# Patient Record
Sex: Male | Born: 1940 | Race: White | Hispanic: No | Marital: Married | Smoking: Former smoker
Health system: Southern US, Community
[De-identification: ages and names within clinical notes are randomized; demographics above are authoritative.]

## PROBLEM LIST (undated history)

## (undated) DIAGNOSIS — I10 Essential (primary) hypertension: Secondary | ICD-10-CM

## (undated) DIAGNOSIS — E669 Obesity, unspecified: Secondary | ICD-10-CM

## (undated) DIAGNOSIS — E785 Hyperlipidemia, unspecified: Secondary | ICD-10-CM

## (undated) DIAGNOSIS — G4730 Sleep apnea, unspecified: Secondary | ICD-10-CM

## (undated) DIAGNOSIS — N429 Disorder of prostate, unspecified: Secondary | ICD-10-CM

## (undated) DIAGNOSIS — M1990 Unspecified osteoarthritis, unspecified site: Secondary | ICD-10-CM

## (undated) DIAGNOSIS — K219 Gastro-esophageal reflux disease without esophagitis: Secondary | ICD-10-CM

## (undated) HISTORY — PX: PROSTATE BIOPSY: SHX241

## (undated) HISTORY — DX: Hyperlipidemia, unspecified: E78.5

## (undated) HISTORY — PX: CARDIAC CATHETERIZATION: SHX172

## (undated) HISTORY — DX: Essential (primary) hypertension: I10

## (undated) HISTORY — DX: Obesity, unspecified: E66.9

## (undated) HISTORY — PX: TONSILLECTOMY: SUR1361

## (undated) HISTORY — PX: APPENDECTOMY: SHX54

## (undated) HISTORY — PX: ANKLE SURGERY: SHX546

## (undated) HISTORY — PX: COLONOSCOPY: SHX174

## (undated) HISTORY — PX: FRACTURE SURGERY: SHX138

---

## 2004-11-15 ENCOUNTER — Ambulatory Visit: Payer: Self-pay | Admitting: Internal Medicine

## 2005-02-03 ENCOUNTER — Ambulatory Visit: Payer: Self-pay | Admitting: Unknown Physician Specialty

## 2005-03-20 ENCOUNTER — Ambulatory Visit: Payer: Self-pay | Admitting: Unknown Physician Specialty

## 2006-09-21 ENCOUNTER — Inpatient Hospital Stay: Payer: Self-pay | Admitting: Internal Medicine

## 2006-09-21 ENCOUNTER — Other Ambulatory Visit: Payer: Self-pay

## 2006-10-17 ENCOUNTER — Ambulatory Visit: Payer: Self-pay | Admitting: Internal Medicine

## 2006-11-16 ENCOUNTER — Encounter: Payer: Self-pay | Admitting: Cardiology

## 2008-04-11 ENCOUNTER — Ambulatory Visit: Payer: Self-pay | Admitting: Internal Medicine

## 2008-10-25 ENCOUNTER — Ambulatory Visit: Payer: Self-pay | Admitting: Unknown Physician Specialty

## 2008-11-09 ENCOUNTER — Ambulatory Visit: Payer: Self-pay | Admitting: Unknown Physician Specialty

## 2011-06-12 ENCOUNTER — Encounter: Payer: Self-pay | Admitting: *Deleted

## 2012-11-23 ENCOUNTER — Ambulatory Visit: Payer: Self-pay | Admitting: Cardiology

## 2012-12-07 ENCOUNTER — Encounter: Payer: Self-pay | Admitting: Cardiology

## 2013-09-14 DIAGNOSIS — F32A Depression, unspecified: Secondary | ICD-10-CM | POA: Insufficient documentation

## 2013-09-14 DIAGNOSIS — M9979 Connective tissue and disc stenosis of intervertebral foramina of abdomen and other regions: Secondary | ICD-10-CM | POA: Insufficient documentation

## 2013-09-14 DIAGNOSIS — I10 Essential (primary) hypertension: Secondary | ICD-10-CM | POA: Insufficient documentation

## 2013-09-14 DIAGNOSIS — F419 Anxiety disorder, unspecified: Secondary | ICD-10-CM

## 2013-09-14 DIAGNOSIS — N400 Enlarged prostate without lower urinary tract symptoms: Secondary | ICD-10-CM | POA: Insufficient documentation

## 2013-09-14 DIAGNOSIS — E669 Obesity, unspecified: Secondary | ICD-10-CM | POA: Insufficient documentation

## 2013-09-14 DIAGNOSIS — G4733 Obstructive sleep apnea (adult) (pediatric): Secondary | ICD-10-CM | POA: Insufficient documentation

## 2013-09-14 DIAGNOSIS — F329 Major depressive disorder, single episode, unspecified: Secondary | ICD-10-CM | POA: Insufficient documentation

## 2013-09-14 DIAGNOSIS — E785 Hyperlipidemia, unspecified: Secondary | ICD-10-CM | POA: Insufficient documentation

## 2013-12-22 DIAGNOSIS — Z86010 Personal history of colonic polyps: Secondary | ICD-10-CM | POA: Insufficient documentation

## 2013-12-22 DIAGNOSIS — K760 Fatty (change of) liver, not elsewhere classified: Secondary | ICD-10-CM | POA: Insufficient documentation

## 2013-12-22 DIAGNOSIS — Z800 Family history of malignant neoplasm of digestive organs: Secondary | ICD-10-CM | POA: Insufficient documentation

## 2014-02-02 ENCOUNTER — Ambulatory Visit: Payer: Self-pay | Admitting: Unknown Physician Specialty

## 2014-06-19 LAB — SURGICAL PATHOLOGY

## 2014-09-20 DIAGNOSIS — E119 Type 2 diabetes mellitus without complications: Secondary | ICD-10-CM | POA: Insufficient documentation

## 2015-03-20 DIAGNOSIS — L03311 Cellulitis of abdominal wall: Secondary | ICD-10-CM | POA: Diagnosis not present

## 2015-03-20 DIAGNOSIS — E1122 Type 2 diabetes mellitus with diabetic chronic kidney disease: Secondary | ICD-10-CM | POA: Diagnosis not present

## 2015-03-20 DIAGNOSIS — N183 Chronic kidney disease, stage 3 (moderate): Secondary | ICD-10-CM | POA: Diagnosis not present

## 2015-04-24 DIAGNOSIS — Z79899 Other long term (current) drug therapy: Secondary | ICD-10-CM | POA: Diagnosis not present

## 2015-04-24 DIAGNOSIS — Z125 Encounter for screening for malignant neoplasm of prostate: Secondary | ICD-10-CM | POA: Diagnosis not present

## 2015-04-24 DIAGNOSIS — I10 Essential (primary) hypertension: Secondary | ICD-10-CM | POA: Diagnosis not present

## 2015-04-24 DIAGNOSIS — E782 Mixed hyperlipidemia: Secondary | ICD-10-CM | POA: Diagnosis not present

## 2015-04-24 DIAGNOSIS — N183 Chronic kidney disease, stage 3 (moderate): Secondary | ICD-10-CM | POA: Diagnosis not present

## 2015-04-24 DIAGNOSIS — E1122 Type 2 diabetes mellitus with diabetic chronic kidney disease: Secondary | ICD-10-CM | POA: Diagnosis not present

## 2015-05-02 DIAGNOSIS — E559 Vitamin D deficiency, unspecified: Secondary | ICD-10-CM | POA: Diagnosis not present

## 2015-05-02 DIAGNOSIS — Z794 Long term (current) use of insulin: Secondary | ICD-10-CM | POA: Diagnosis not present

## 2015-05-02 DIAGNOSIS — E781 Pure hyperglyceridemia: Secondary | ICD-10-CM | POA: Diagnosis not present

## 2015-05-02 DIAGNOSIS — E1122 Type 2 diabetes mellitus with diabetic chronic kidney disease: Secondary | ICD-10-CM | POA: Diagnosis not present

## 2015-05-02 DIAGNOSIS — N183 Chronic kidney disease, stage 3 (moderate): Secondary | ICD-10-CM | POA: Diagnosis not present

## 2015-05-02 DIAGNOSIS — G720 Drug-induced myopathy: Secondary | ICD-10-CM | POA: Diagnosis not present

## 2015-05-02 DIAGNOSIS — T466X1A Poisoning by antihyperlipidemic and antiarteriosclerotic drugs, accidental (unintentional), initial encounter: Secondary | ICD-10-CM | POA: Diagnosis not present

## 2015-05-18 ENCOUNTER — Ambulatory Visit (INDEPENDENT_AMBULATORY_CARE_PROVIDER_SITE_OTHER): Payer: PPO | Admitting: Urology

## 2015-05-18 ENCOUNTER — Encounter: Payer: Self-pay | Admitting: Urology

## 2015-05-18 VITALS — BP 122/69 | HR 62 | Ht 67.0 in | Wt 248.0 lb

## 2015-05-18 DIAGNOSIS — N183 Chronic kidney disease, stage 3 unspecified: Secondary | ICD-10-CM | POA: Insufficient documentation

## 2015-05-18 DIAGNOSIS — R350 Frequency of micturition: Secondary | ICD-10-CM

## 2015-05-18 DIAGNOSIS — R972 Elevated prostate specific antigen [PSA]: Secondary | ICD-10-CM | POA: Diagnosis not present

## 2015-05-18 DIAGNOSIS — R351 Nocturia: Secondary | ICD-10-CM | POA: Diagnosis not present

## 2015-05-18 LAB — MICROSCOPIC EXAMINATION
Bacteria, UA: NONE SEEN
Epithelial Cells (non renal): NONE SEEN /hpf (ref 0–10)
RBC, UA: NONE SEEN /hpf (ref 0–?)
WBC, UA: NONE SEEN /hpf (ref 0–?)

## 2015-05-18 LAB — URINALYSIS, COMPLETE
Bilirubin, UA: NEGATIVE
GLUCOSE, UA: NEGATIVE
KETONES UA: NEGATIVE
LEUKOCYTES UA: NEGATIVE
NITRITE UA: NEGATIVE
PROTEIN UA: NEGATIVE
RBC, UA: NEGATIVE
SPEC GRAV UA: 1.02 (ref 1.005–1.030)
Urobilinogen, Ur: 0.2 mg/dL (ref 0.2–1.0)
pH, UA: 5.5 (ref 5.0–7.5)

## 2015-05-18 NOTE — Progress Notes (Signed)
**Note Mario-Identified via Obfuscation** 05/18/2015 1:45 PM   Mario Robinson [birth year] [id number]  Referring provider: No referring provider defined for this encounter.  Chief Complaint  Patient presents with  . Elevated PSA    New Patient    HPI: 75 yo M referred for Dr. Doy Hutching for evaluation of elevated PSA and urinary symptoms.    Today, he complains of nocturia 2-3x, urinary urgency, and occasional urge incontinence.  He does have daytime frequency but he does admit to drinking plenty of water.  He does have an occasionally weak stream and does feel that he emtpy his bladder for the most part.    He does drink 2-3 cups of coffee each day.    He does have a family history of prostate cancer with 2 brothers dx in there late 31s.    PSA 5/15 3.94         7/16 4.58         2/17 5.27  PHx significant for DM x 20 years, obesity, OSA (does not wear a CPAP).    PMH: Past Medical History  Diagnosis Date  . Obesity   . Diabetes mellitus   . HTN (hypertension)   . HLD (hyperlipidemia)     Surgical History: Past Surgical History  Procedure Laterality Date  . Appendectomy    . Ankle surgery    . Tonsillectomy      Home Medications:    Medication List       This list is accurate as of: 05/18/15  1:45 PM.  Always use your most recent med list.               alfuzosin 10 MG 24 hr tablet  Commonly known as:  UROXATRAL     amLODipine 5 MG tablet  Commonly known as:  NORVASC     aspirin 81 MG tablet  Take 81 mg by mouth daily.     Fish Oil 600 MG Caps  Take by mouth.     glipiZIDE 10 MG 24 hr tablet  Commonly known as:  GLUCOTROL XL  Take 10 mg by mouth daily.     hydrochlorothiazide 12.5 MG capsule  Commonly known as:  MICROZIDE  Take 12.5 mg by mouth daily.     lisinopril 20 MG tablet  Commonly known as:  PRINIVIL,ZESTRIL  Take 30 mg by mouth daily.     losartan-hydrochlorothiazide 100-12.5 MG tablet  Commonly known as:  HYZAAR     metFORMIN 1000 MG tablet  Commonly known as:   GLUCOPHAGE  Take 1,000 mg by mouth 2 (two) times daily with a meal.     metoprolol 50 MG tablet  Commonly known as:  LOPRESSOR  Take 50 mg by mouth 2 (two) times daily.     mupirocin ointment 2 %  Commonly known as:  BACTROBAN     NOVOLIN 70/30 RELION (70-30) 100 UNIT/ML injection  Generic drug:  insulin NPH-regular Human     omeprazole 20 MG capsule  Commonly known as:  PRILOSEC  Take 20 mg by mouth daily.     Vitamin D3 2000 units capsule  Take by mouth.        Allergies: No Known Allergies  Family History: Family History  Problem Relation Age of Onset  . Heart attack    . Colon cancer Mother   . Prostate cancer Brother   . Hypertension    . Arthritis    . Bladder Cancer Neg Hx   . Kidney cancer Neg Hx  Social History:  reports that he has quit smoking. He does not have any smokeless tobacco history on file. He reports that he drinks alcohol. He reports that he does not use illicit drugs.  ROS: UROLOGY Frequent Urination?: Yes Hard to postpone urination?: Yes Burning/pain with urination?: No Get up at night to urinate?: Yes Leakage of urine?: Yes Urine stream starts and stops?: No Trouble starting stream?: No Do you have to strain to urinate?: No Blood in urine?: No Urinary tract infection?: No Sexually transmitted disease?: No Injury to kidneys or bladder?: No Painful intercourse?: No Weak stream?: Yes Erection problems?: Yes Penile pain?: No  Gastrointestinal Nausea?: No Vomiting?: No Indigestion/heartburn?: Yes Diarrhea?: No Constipation?: No  Constitutional Fever: No Night sweats?: No Weight loss?: No Fatigue?: No  Skin Skin rash/lesions?: No Itching?: No  Eyes Blurred vision?: No Double vision?: No  Ears/Nose/Throat Sore throat?: No Sinus problems?: Yes  Hematologic/Lymphatic Swollen glands?: No Easy bruising?: No  Cardiovascular Leg swelling?: No Chest pain?: No  Respiratory Cough?: No Shortness of breath?:  No  Endocrine Excessive thirst?: No  Musculoskeletal Back pain?: No Joint pain?: No  Neurological Headaches?: No Dizziness?: No  Psychologic Depression?: No Anxiety?: No  Physical Exam: BP 122/69 mmHg  Pulse 62  Ht 5\' 7"  (1.702 m)  Wt 248 lb (112.492 kg)  BMI 38.83 kg/m2  Constitutional:  Alert and oriented, No acute distress.   Accompanied by his wife today. HEENT: [place] AT, moist mucus membranes.  Trachea midline, no masses. Cardiovascular: No clubbing, cyanosis, or edema. Respiratory: Normal respiratory effort, no increased work of breathing. GI: Abdomen is soft, nontender, nondistended, no abdominal masses.   Obese with large pannus. GU: No CVA tenderness. Normal phallus with bilateral descended testicles. No masses.   DRE: Normal sphincter tone.  Small 20 g prostate, nontender, no nodules.   Skin: No rashes, bruises or suspicious lesions. Lymph: No cervical or inguinal adenopathy. Neurologic: Grossly intact, no focal deficits, moving all 4 extremities. Psychiatric: Normal mood and affect.  Laboratory Data:  A1c 6.9 04/24/15  CBC w/auto Differential (5 Part) (04/24/2015 7:49 AM) CBC w/auto Differential (5 Part) (04/24/2015 7:49 AM)  Component Value Ref Range  WBC (White Blood Cell Count) 11.5 (H) 4.1 - 10.2 10^3/uL  RBC (Red Blood Cell Count) 4.39 (L) 4.69 - 6.13 10^6/uL  Hemoglobin 13.5 (L) 14.1 - 18.1 gm/dL  Hematocrit 39.8 (L) 40.0 - 52.0 %   Comprehensive Metabolic Panel (CMP) (XX123456 7:49 AM) Comprehensive Metabolic Panel (CMP) (XX123456 7:49 AM)  Component Value Ref Range  Glucose 157 (H) 70 - 110 mg/dL  Sodium 140 136 - 145 mmol/L  Potassium 5.3 (H) 3.6 - 5.1 mmol/L  Chloride 106 97 - 109 mmol/L  Carbon Dioxide (CO2) 23.6 22.0 - 32.0 mmol/L  Urea Nitrogen (BUN) 25 7 - 25 mg/dL  Creatinine 1.9 (H) 0.7 - 1.3 mg/dL    Urinalysis UA today with negative dip/ micro.  See EPIC for details.   Pertinent Imaging: n/a  Assessment & Plan:    1.  Elevated PSA  We reviewed the implications of an elevated PSA and the uncertainty surrounding it. In general, a man's PSA increases with age and is produced by both normal and cancerous prostate tissue. Differential for elevated PSA is BPH, prostate cancer, infection, recent intercourse/ejaculation, prostate infarction, recent urethroscopic manipulation (foley placement/cystoscopy) and prostatitis. Management of an elevated PSA can include observation or prostate biopsy and wediscussed this in detail. We discussed that indications for prostate biopsy are defined by age and  race specific PSA cutoffs as well as a PSA velocity of 0.75/year.   Given the overall trend of his PSA with a slow steady rise, I'm highly concerning for prostate cancer. Additionally, he is a family history of prostate cancer. As such, I have recommended proceeding with prostate biopsy. He will need to hold his aspirin for 7 days prior to the procedure.  We discussed prostate biopsy in detail including the procedure itself, the risks of blood in the urine, stool, and ejaculate, serious infection, and discomfort. He is willing to proceed with this as discussed.  - Urinalysis, Complete  2. CKD (chronic kidney disease) stage 3, GFR 30-59 ml/min stable  3. Urinary frequency  Discussed behavioral modification today cutting back bladder irritants including coffee. 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  4. Nocturia  Discussed nocturia and relationship to untreated OSA. I have advised him to discuss this further with his PCP as he may consider referral back to sleep center for reevaluation. We discussed that there are newer masks including nasal mask which may be more comfortable. I suspect treating his OSA may improve his nocturia symptoms.    Return in about 2 weeks (around 06/01/2015) for prostate biopsy.  Hollice Espy, MD  [facility] [address], [place], [place] [address] [phone]

## 2015-06-15 ENCOUNTER — Ambulatory Visit (INDEPENDENT_AMBULATORY_CARE_PROVIDER_SITE_OTHER): Payer: PPO | Admitting: Urology

## 2015-06-15 ENCOUNTER — Other Ambulatory Visit: Payer: Self-pay | Admitting: Urology

## 2015-06-15 VITALS — BP 146/75 | HR 72 | Ht 67.0 in | Wt 244.0 lb

## 2015-06-15 DIAGNOSIS — R972 Elevated prostate specific antigen [PSA]: Secondary | ICD-10-CM | POA: Diagnosis not present

## 2015-06-15 MED ORDER — LEVOFLOXACIN 500 MG PO TABS
500.0000 mg | ORAL_TABLET | Freq: Once | ORAL | Status: AC
  Administered 2015-06-15: 500 mg via ORAL

## 2015-06-15 MED ORDER — GENTAMICIN SULFATE 40 MG/ML IJ SOLN
80.0000 mg | Freq: Once | INTRAMUSCULAR | Status: AC
  Administered 2015-06-15: 80 mg via INTRAMUSCULAR

## 2015-06-15 NOTE — Progress Notes (Signed)
06/15/2015   HPI: 75 year old male with rising PSA who presents a for prostate biopsy. Rectal exam benign with small gland.  Strong family history of prostate cancer.  PSA 5/15 3.94  7/16 4.58  2/17 5.27  Prostate Biopsy Procedure   Informed consent was obtained after discussing risks/benefits of the procedure.  A time out was performed to ensure correct patient identity.  Pre-Procedure: - Gentamicin given prophylactically - Levaquin 500 mg administered PO -Transrectal Ultrasound performed revealing a 50 gm prostate -No significant hypoechoic or median lobe noted  Procedure: - Prostate block performed using 10 cc 1% lidocaine and biopsies taken from sextant areas, a total of 12 under ultrasound guidance.  Post-Procedure: - Patient tolerated the procedure well - He was counseled to seek immediate medical attention if experiences any severe pain, significant bleeding, or fevers - Return in one week to discuss biopsy results  Hollice Espy, MD

## 2015-06-21 ENCOUNTER — Telehealth: Payer: Self-pay | Admitting: Urology

## 2015-06-21 LAB — PATHOLOGY REPORT

## 2015-06-21 NOTE — Telephone Encounter (Signed)
done

## 2015-06-21 NOTE — Telephone Encounter (Signed)
Prostate biopsy results negative.  Evidence of acute and chronic inflammation which may be cause of PSA elevation.  Called patient to discuss.  Recommend f/u in 6 months with PSA/ DRE.  Hollice Espy, MD

## 2015-06-22 ENCOUNTER — Other Ambulatory Visit: Payer: Self-pay | Admitting: Urology

## 2015-06-26 ENCOUNTER — Ambulatory Visit: Payer: PPO | Admitting: Urology

## 2015-07-03 ENCOUNTER — Ambulatory Visit: Payer: PPO | Admitting: Urology

## 2015-07-24 DIAGNOSIS — E1122 Type 2 diabetes mellitus with diabetic chronic kidney disease: Secondary | ICD-10-CM | POA: Diagnosis not present

## 2015-07-24 DIAGNOSIS — Z79899 Other long term (current) drug therapy: Secondary | ICD-10-CM | POA: Diagnosis not present

## 2015-07-24 DIAGNOSIS — Z0000 Encounter for general adult medical examination without abnormal findings: Secondary | ICD-10-CM | POA: Diagnosis not present

## 2015-07-24 DIAGNOSIS — E782 Mixed hyperlipidemia: Secondary | ICD-10-CM | POA: Diagnosis not present

## 2015-07-24 DIAGNOSIS — E6609 Other obesity due to excess calories: Secondary | ICD-10-CM | POA: Diagnosis not present

## 2015-07-24 DIAGNOSIS — N183 Chronic kidney disease, stage 3 (moderate): Secondary | ICD-10-CM | POA: Diagnosis not present

## 2015-07-24 DIAGNOSIS — I10 Essential (primary) hypertension: Secondary | ICD-10-CM | POA: Diagnosis not present

## 2015-07-24 DIAGNOSIS — L739 Follicular disorder, unspecified: Secondary | ICD-10-CM | POA: Diagnosis not present

## 2015-07-26 DIAGNOSIS — Z794 Long term (current) use of insulin: Secondary | ICD-10-CM | POA: Diagnosis not present

## 2015-07-26 DIAGNOSIS — N183 Chronic kidney disease, stage 3 (moderate): Secondary | ICD-10-CM | POA: Diagnosis not present

## 2015-07-26 DIAGNOSIS — I10 Essential (primary) hypertension: Secondary | ICD-10-CM | POA: Diagnosis not present

## 2015-07-26 DIAGNOSIS — E1122 Type 2 diabetes mellitus with diabetic chronic kidney disease: Secondary | ICD-10-CM | POA: Diagnosis not present

## 2015-07-26 DIAGNOSIS — Z79899 Other long term (current) drug therapy: Secondary | ICD-10-CM | POA: Diagnosis not present

## 2015-07-26 DIAGNOSIS — E559 Vitamin D deficiency, unspecified: Secondary | ICD-10-CM | POA: Diagnosis not present

## 2015-08-02 DIAGNOSIS — N183 Chronic kidney disease, stage 3 (moderate): Secondary | ICD-10-CM | POA: Diagnosis not present

## 2015-08-02 DIAGNOSIS — T466X1A Poisoning by antihyperlipidemic and antiarteriosclerotic drugs, accidental (unintentional), initial encounter: Secondary | ICD-10-CM | POA: Diagnosis not present

## 2015-08-02 DIAGNOSIS — E559 Vitamin D deficiency, unspecified: Secondary | ICD-10-CM | POA: Diagnosis not present

## 2015-08-02 DIAGNOSIS — E1122 Type 2 diabetes mellitus with diabetic chronic kidney disease: Secondary | ICD-10-CM | POA: Diagnosis not present

## 2015-08-02 DIAGNOSIS — Z794 Long term (current) use of insulin: Secondary | ICD-10-CM | POA: Diagnosis not present

## 2015-08-02 DIAGNOSIS — G720 Drug-induced myopathy: Secondary | ICD-10-CM | POA: Diagnosis not present

## 2015-08-02 DIAGNOSIS — E781 Pure hyperglyceridemia: Secondary | ICD-10-CM | POA: Diagnosis not present

## 2015-11-28 DIAGNOSIS — N183 Chronic kidney disease, stage 3 (moderate): Secondary | ICD-10-CM | POA: Diagnosis not present

## 2015-11-28 DIAGNOSIS — Z794 Long term (current) use of insulin: Secondary | ICD-10-CM | POA: Diagnosis not present

## 2015-11-28 DIAGNOSIS — E1122 Type 2 diabetes mellitus with diabetic chronic kidney disease: Secondary | ICD-10-CM | POA: Diagnosis not present

## 2015-12-05 DIAGNOSIS — E1122 Type 2 diabetes mellitus with diabetic chronic kidney disease: Secondary | ICD-10-CM | POA: Diagnosis not present

## 2015-12-05 DIAGNOSIS — Z794 Long term (current) use of insulin: Secondary | ICD-10-CM | POA: Diagnosis not present

## 2015-12-05 DIAGNOSIS — Z8639 Personal history of other endocrine, nutritional and metabolic disease: Secondary | ICD-10-CM | POA: Diagnosis not present

## 2015-12-05 DIAGNOSIS — N183 Chronic kidney disease, stage 3 (moderate): Secondary | ICD-10-CM | POA: Diagnosis not present

## 2015-12-21 ENCOUNTER — Ambulatory Visit: Payer: PPO | Admitting: Urology

## 2015-12-24 ENCOUNTER — Other Ambulatory Visit: Payer: Self-pay

## 2015-12-24 DIAGNOSIS — R972 Elevated prostate specific antigen [PSA]: Secondary | ICD-10-CM

## 2015-12-25 ENCOUNTER — Other Ambulatory Visit: Payer: PPO

## 2015-12-25 DIAGNOSIS — R972 Elevated prostate specific antigen [PSA]: Secondary | ICD-10-CM

## 2015-12-26 ENCOUNTER — Encounter: Payer: Self-pay | Admitting: Urology

## 2015-12-26 ENCOUNTER — Ambulatory Visit (INDEPENDENT_AMBULATORY_CARE_PROVIDER_SITE_OTHER): Payer: PPO | Admitting: Urology

## 2015-12-26 VITALS — BP 130/73 | HR 62 | Ht 67.0 in | Wt 249.0 lb

## 2015-12-26 DIAGNOSIS — R972 Elevated prostate specific antigen [PSA]: Secondary | ICD-10-CM

## 2015-12-26 DIAGNOSIS — N183 Chronic kidney disease, stage 3 unspecified: Secondary | ICD-10-CM

## 2015-12-26 DIAGNOSIS — R351 Nocturia: Secondary | ICD-10-CM

## 2015-12-26 DIAGNOSIS — R350 Frequency of micturition: Secondary | ICD-10-CM

## 2015-12-26 LAB — PSA: Prostate Specific Ag, Serum: 5.1 ng/mL — ABNORMAL HIGH (ref 0.0–4.0)

## 2015-12-26 NOTE — Progress Notes (Signed)
12/26/2015 1:32 PM   De Burrs Aug 11, 1940 [id number]  Referring provider: Idelle Crouch, MD [facility] [place], [place] [address]  Chief Complaint  Patient presents with  . Elevated PSA    38month follow up    HPI: 75 yo M initally referred for Dr. Doy Hutching for elevated PSA s/p negative prostate biopsy on 06/15/15 with chronic inflammation.  TRUS volume 50 g.  He does have a family history of prostate cancer with 2 brothers dx in there late 73s.    PSA 5/15 3.94         7/16 4.58         2/17 5.27         10/17  5.1  He continues to complain urinary frequency and nocturia x 2-4.  He continues to be Noncompliant with CPAP and behavioral modification. He continues to drink 3 or more cups of coffee each morning.  PHx significant for DM x 20 years, obesity, OSA (does not wear a CPAP).    PMH: Past Medical History:  Diagnosis Date  . Diabetes mellitus   . HLD (hyperlipidemia)   . HTN (hypertension)   . Obesity     Surgical History: Past Surgical History:  Procedure Laterality Date  . ANKLE SURGERY    . APPENDECTOMY    . TONSILLECTOMY      Home Medications:    Medication List       Accurate as of 12/26/15  1:32 PM. Always use your most recent med list.          alfuzosin 10 MG 24 hr tablet Commonly known as:  UROXATRAL   amLODipine 5 MG tablet Commonly known as:  NORVASC   amoxicillin 875 MG tablet Commonly known as:  AMOXIL   aspirin 81 MG tablet Take 81 mg by mouth daily. Reported on 06/15/2015   Fish Oil 600 MG Caps Take by mouth.   glipiZIDE 10 MG 24 hr tablet Commonly known as:  GLUCOTROL XL Take 10 mg by mouth daily.   hydrochlorothiazide 12.5 MG capsule Commonly known as:  MICROZIDE Take 12.5 mg by mouth daily.   lisinopril 20 MG tablet Commonly known as:  PRINIVIL,ZESTRIL Take 30 mg by mouth daily.   losartan-hydrochlorothiazide 100-12.5 MG tablet Commonly known as:  HYZAAR   metFORMIN 1000 MG  tablet Commonly known as:  GLUCOPHAGE Take 1,000 mg by mouth 2 (two) times daily with a meal.   metoprolol 50 MG tablet Commonly known as:  LOPRESSOR Take 50 mg by mouth 2 (two) times daily.   mupirocin ointment 2 % Commonly known as:  BACTROBAN   NOVOLIN 70/30 RELION (70-30) 100 UNIT/ML injection Generic drug:  insulin NPH-regular Human   omeprazole 20 MG capsule Commonly known as:  PRILOSEC Take 20 mg by mouth daily.   Vitamin D3 2000 units capsule Take by mouth. Reported on 06/15/2015       Allergies: No Known Allergies  Family History: Family History  Problem Relation Age of Onset  . Heart attack    . Colon cancer Mother   . Prostate cancer Brother   . Hypertension    . Arthritis    . Bladder Cancer Neg Hx   . Kidney cancer Neg Hx     Social History:  reports that he has quit smoking. He quit after 35.00 years of use. He has never used smokeless tobacco. He reports that he drinks alcohol. He reports that he does not use drugs.  ROS: UROLOGY Frequent Urination?: Yes Hard to postpone urination?: Yes Burning/pain with urination?: No Get up at night to urinate?: Yes Leakage of urine?: Yes Urine stream starts and stops?: No Trouble starting stream?: No Do you have to strain to urinate?: No Blood in urine?: No Urinary tract infection?: No Sexually transmitted disease?: No Injury to kidneys or bladder?: No Painful intercourse?: No Weak stream?: No Erection problems?: Yes Penile pain?: No  Gastrointestinal Nausea?: No Vomiting?: No Indigestion/heartburn?: No Diarrhea?: No Constipation?: No  Constitutional Fever: No Night sweats?: No Weight loss?: No Fatigue?: No  Skin Skin rash/lesions?: No Itching?: No  Eyes Blurred vision?: No Double vision?: No  Ears/Nose/Throat Sore throat?: No Sinus problems?: No  Hematologic/Lymphatic Swollen glands?: No Easy bruising?: No  Cardiovascular Leg swelling?: No Chest pain?:  No  Respiratory Cough?: No Shortness of breath?: No  Endocrine Excessive thirst?: No  Musculoskeletal Back pain?: No Joint pain?: No  Neurological Headaches?: No Dizziness?: No  Psychologic Depression?: No Anxiety?: No  Physical Exam: BP 130/73   Pulse 62   Ht 5\' 7"  (1.702 m)   Wt 249 lb (112.9 kg)   BMI 39.00 kg/m   Constitutional:  Alert and oriented, No acute distress.   Accompanied by his wife today. HEENT: [place] AT, moist mucus membranes.  Trachea midline, no masses. Cardiovascular: No clubbing, cyanosis, or edema. Respiratory: Normal respiratory effort, no increased work of breathing. GI: Abdomen is soft, nontender, nondistended, no abdominal masses.   Obese with large pannus. GU: No CVA tenderness. Normal phallus with bilateral descended testicles. No masses.   Skin: No rashes, bruises or suspicious lesions. Neurologic: Grossly intact, no focal deficits, moving all 4 extremities. Psychiatric: Normal mood and affect.  Laboratory Data:  A1c 6.9 04/24/15  CBC w/auto Differential (5 Part) (04/24/2015 7:49 AM) CBC w/auto Differential (5 Part) (04/24/2015 7:49 AM)  Component Value Ref Range  WBC (White Blood Cell Count) 11.5 (H) 4.1 - 10.2 10^3/uL  RBC (Red Blood Cell Count) 4.39 (L) 4.69 - 6.13 10^6/uL  Hemoglobin 13.5 (L) 14.1 - 18.1 gm/dL  Hematocrit 39.8 (L) 40.0 - 52.0 %   Comprehensive Metabolic Panel (CMP) (56/31/4970 7:49 AM) Comprehensive Metabolic Panel (CMP) (26/37/8588 7:49 AM)  Component Value Ref Range  Glucose 157 (H) 70 - 110 mg/dL  Sodium 140 136 - 145 mmol/L  Potassium 5.3 (H) 3.6 - 5.1 mmol/L  Chloride 106 97 - 109 mmol/L  Carbon Dioxide (CO2) 23.6 22.0 - 32.0 mmol/L  Urea Nitrogen (BUN) 25 7 - 25 mg/dL  Creatinine 1.9 (H) 0.7 - 1.3 mg/dL     Pertinent Imaging: n/a  Assessment & Plan:    1. Elevated PSA PSA stable today Status post negative biopsy with chronic inflammation 6 months ago Based on his age and comorbidities, I would  recommend one final PSA check in 12 months with rectal exam and consider cessation  PSA screening at that time- he is agreeable with this plan  2. CKD (chronic kidney disease) stage 3, GFR 30-59 ml/min stable  3. Urinary frequency Continue to recommend behavioral modification including weight loss, avoidance of your tearing beverages, etc.  4. Nocturia Encourage follow-up with PCP for referral for her repeat sleep study   Return in about 1 year (around 12/25/2016) for PSA /DRE.  Hollice Espy, MD  [facility] [address], [place], [place] [address] [phone]

## 2016-01-23 DIAGNOSIS — E782 Mixed hyperlipidemia: Secondary | ICD-10-CM | POA: Diagnosis not present

## 2016-01-23 DIAGNOSIS — N400 Enlarged prostate without lower urinary tract symptoms: Secondary | ICD-10-CM | POA: Diagnosis not present

## 2016-01-23 DIAGNOSIS — Z79899 Other long term (current) drug therapy: Secondary | ICD-10-CM | POA: Diagnosis not present

## 2016-01-23 DIAGNOSIS — E1122 Type 2 diabetes mellitus with diabetic chronic kidney disease: Secondary | ICD-10-CM | POA: Diagnosis not present

## 2016-01-23 DIAGNOSIS — I10 Essential (primary) hypertension: Secondary | ICD-10-CM | POA: Diagnosis not present

## 2016-01-23 DIAGNOSIS — N183 Chronic kidney disease, stage 3 (moderate): Secondary | ICD-10-CM | POA: Diagnosis not present

## 2016-01-23 DIAGNOSIS — Z0000 Encounter for general adult medical examination without abnormal findings: Secondary | ICD-10-CM | POA: Diagnosis not present

## 2016-02-04 DIAGNOSIS — I10 Essential (primary) hypertension: Secondary | ICD-10-CM | POA: Diagnosis not present

## 2016-02-29 DIAGNOSIS — H2513 Age-related nuclear cataract, bilateral: Secondary | ICD-10-CM | POA: Diagnosis not present

## 2016-04-04 DIAGNOSIS — Z8639 Personal history of other endocrine, nutritional and metabolic disease: Secondary | ICD-10-CM | POA: Diagnosis not present

## 2016-04-04 DIAGNOSIS — N183 Chronic kidney disease, stage 3 (moderate): Secondary | ICD-10-CM | POA: Diagnosis not present

## 2016-04-04 DIAGNOSIS — I10 Essential (primary) hypertension: Secondary | ICD-10-CM | POA: Diagnosis not present

## 2016-04-04 DIAGNOSIS — E1122 Type 2 diabetes mellitus with diabetic chronic kidney disease: Secondary | ICD-10-CM | POA: Diagnosis not present

## 2016-04-04 DIAGNOSIS — E782 Mixed hyperlipidemia: Secondary | ICD-10-CM | POA: Diagnosis not present

## 2016-04-04 DIAGNOSIS — Z794 Long term (current) use of insulin: Secondary | ICD-10-CM | POA: Diagnosis not present

## 2016-04-04 DIAGNOSIS — Z79899 Other long term (current) drug therapy: Secondary | ICD-10-CM | POA: Diagnosis not present

## 2016-04-09 DIAGNOSIS — E781 Pure hyperglyceridemia: Secondary | ICD-10-CM | POA: Diagnosis not present

## 2016-04-09 DIAGNOSIS — N183 Chronic kidney disease, stage 3 (moderate): Secondary | ICD-10-CM | POA: Diagnosis not present

## 2016-04-09 DIAGNOSIS — E1122 Type 2 diabetes mellitus with diabetic chronic kidney disease: Secondary | ICD-10-CM | POA: Diagnosis not present

## 2016-04-09 DIAGNOSIS — Z794 Long term (current) use of insulin: Secondary | ICD-10-CM | POA: Diagnosis not present

## 2016-04-22 DIAGNOSIS — H2513 Age-related nuclear cataract, bilateral: Secondary | ICD-10-CM | POA: Diagnosis not present

## 2016-04-23 ENCOUNTER — Encounter: Payer: Self-pay | Admitting: *Deleted

## 2016-05-01 ENCOUNTER — Ambulatory Visit: Payer: PPO | Admitting: Anesthesiology

## 2016-05-01 ENCOUNTER — Ambulatory Visit
Admission: RE | Admit: 2016-05-01 | Discharge: 2016-05-01 | Disposition: A | Discharge status: Home / Self Care | Payer: PPO | Source: Ambulatory Visit | Attending: Ophthalmology | Admitting: Ophthalmology

## 2016-05-01 ENCOUNTER — Encounter
Admission: RE | Disposition: A | Discharge status: Home / Self Care | Payer: Self-pay | Source: Ambulatory Visit | Attending: Ophthalmology

## 2016-05-01 ENCOUNTER — Encounter: Payer: Self-pay | Admitting: *Deleted

## 2016-05-01 DIAGNOSIS — Z87891 Personal history of nicotine dependence: Secondary | ICD-10-CM | POA: Insufficient documentation

## 2016-05-01 DIAGNOSIS — I10 Essential (primary) hypertension: Secondary | ICD-10-CM | POA: Insufficient documentation

## 2016-05-01 DIAGNOSIS — E1136 Type 2 diabetes mellitus with diabetic cataract: Secondary | ICD-10-CM | POA: Diagnosis not present

## 2016-05-01 DIAGNOSIS — M1990 Unspecified osteoarthritis, unspecified site: Secondary | ICD-10-CM | POA: Diagnosis not present

## 2016-05-01 DIAGNOSIS — K219 Gastro-esophageal reflux disease without esophagitis: Secondary | ICD-10-CM | POA: Diagnosis not present

## 2016-05-01 DIAGNOSIS — H2512 Age-related nuclear cataract, left eye: Secondary | ICD-10-CM | POA: Diagnosis not present

## 2016-05-01 DIAGNOSIS — E119 Type 2 diabetes mellitus without complications: Secondary | ICD-10-CM | POA: Diagnosis not present

## 2016-05-01 DIAGNOSIS — G4730 Sleep apnea, unspecified: Secondary | ICD-10-CM | POA: Diagnosis not present

## 2016-05-01 DIAGNOSIS — H2513 Age-related nuclear cataract, bilateral: Secondary | ICD-10-CM | POA: Diagnosis not present

## 2016-05-01 DIAGNOSIS — N289 Disorder of kidney and ureter, unspecified: Secondary | ICD-10-CM | POA: Insufficient documentation

## 2016-05-01 HISTORY — DX: Gastro-esophageal reflux disease without esophagitis: K21.9

## 2016-05-01 HISTORY — DX: Sleep apnea, unspecified: G47.30

## 2016-05-01 HISTORY — DX: Unspecified osteoarthritis, unspecified site: M19.90

## 2016-05-01 HISTORY — DX: Disorder of prostate, unspecified: N42.9

## 2016-05-01 HISTORY — PX: CATARACT EXTRACTION W/PHACO: SHX586

## 2016-05-01 LAB — GLUCOSE, CAPILLARY: Glucose-Capillary: 128 mg/dL — ABNORMAL HIGH (ref 65–99)

## 2016-05-01 SURGERY — PHACOEMULSIFICATION, CATARACT, WITH IOL INSERTION
Anesthesia: Monitor Anesthesia Care | Site: Eye | Laterality: Left | Wound class: Clean

## 2016-05-01 MED ORDER — SODIUM HYALURONATE 23 MG/ML IO SOLN
INTRAOCULAR | Status: AC
  Filled 2016-05-01: qty 0.6

## 2016-05-01 MED ORDER — ARMC OPHTHALMIC DILATING DROPS
1.0000 "application " | OPHTHALMIC | Status: AC
  Administered 2016-05-01 (×3): 1 via OPHTHALMIC

## 2016-05-01 MED ORDER — FENTANYL CITRATE (PF) 100 MCG/2ML IJ SOLN
INTRAMUSCULAR | Status: AC
  Filled 2016-05-01: qty 2

## 2016-05-01 MED ORDER — MIDAZOLAM HCL 2 MG/2ML IJ SOLN
INTRAMUSCULAR | Status: AC
  Filled 2016-05-01: qty 2

## 2016-05-01 MED ORDER — POVIDONE-IODINE 5 % OP SOLN
OPHTHALMIC | Status: AC
  Filled 2016-05-01: qty 30

## 2016-05-01 MED ORDER — MOXIFLOXACIN HCL 0.5 % OP SOLN
OPHTHALMIC | Status: DC | PRN
  Administered 2016-05-01: 0.2 mL via OPHTHALMIC

## 2016-05-01 MED ORDER — LIDOCAINE HCL (PF) 4 % IJ SOLN
INTRAOCULAR | Status: DC | PRN
  Administered 2016-05-01: 4 mL via OPHTHALMIC

## 2016-05-01 MED ORDER — BSS IO SOLN
INTRAOCULAR | Status: DC | PRN
  Administered 2016-05-01: 200 mL via INTRAOCULAR

## 2016-05-01 MED ORDER — MIDAZOLAM HCL 2 MG/2ML IJ SOLN
INTRAMUSCULAR | Status: DC | PRN
  Administered 2016-05-01: 1 mg via INTRAVENOUS

## 2016-05-01 MED ORDER — SODIUM HYALURONATE 10 MG/ML IO SOLN
INTRAOCULAR | Status: DC | PRN
  Administered 2016-05-01: 0.55 mL via INTRAOCULAR

## 2016-05-01 MED ORDER — MOXIFLOXACIN HCL 0.5 % OP SOLN: 1.0000 [drp] | OPHTHALMIC | Status: DC | PRN

## 2016-05-01 MED ORDER — EPINEPHRINE PF 1 MG/ML IJ SOLN
INTRAMUSCULAR | Status: AC
  Filled 2016-05-01: qty 1

## 2016-05-01 MED ORDER — SODIUM CHLORIDE 0.9 % IV SOLN
INTRAVENOUS | Status: DC
  Administered 2016-05-01: 09:00:00 via INTRAVENOUS

## 2016-05-01 MED ORDER — FENTANYL CITRATE (PF) 100 MCG/2ML IJ SOLN
INTRAMUSCULAR | Status: DC | PRN
  Administered 2016-05-01: 50 ug via INTRAVENOUS

## 2016-05-01 MED ORDER — SODIUM HYALURONATE 10 MG/ML IO SOLN
INTRAOCULAR | Status: AC
  Filled 2016-05-01: qty 0.85

## 2016-05-01 MED ORDER — ONDANSETRON HCL 4 MG/2ML IJ SOLN
INTRAMUSCULAR | Status: DC | PRN
  Administered 2016-05-01: 4 mg via INTRAVENOUS

## 2016-05-01 MED ORDER — SODIUM HYALURONATE 23 MG/ML IO SOLN
INTRAOCULAR | Status: DC | PRN
  Administered 2016-05-01: 0.6 mL via INTRAOCULAR

## 2016-05-01 SURGICAL SUPPLY — 22 items
CANNULA ANT/CHMB 27GA (MISCELLANEOUS) ×4 IMPLANT
CUP MEDICINE 2OZ PLAST GRAD ST (MISCELLANEOUS) ×2 IMPLANT
DISSECTOR HYDRO NUCLEUS 50X22 (MISCELLANEOUS) ×2 IMPLANT
GLOVE BIO SURGEON STRL SZ8 (GLOVE) ×2 IMPLANT
GLOVE BIOGEL M 6.5 STRL (GLOVE) ×2 IMPLANT
GLOVE SURG LX 6.5 MICRO (GLOVE) ×1
GLOVE SURG LX 7.5 STRW (GLOVE) ×1
GLOVE SURG LX STRL 6.5 MICRO (GLOVE) ×1 IMPLANT
GLOVE SURG LX STRL 7.5 STRW (GLOVE) ×1 IMPLANT
GOWN STRL REUS W/ TWL LRG LVL3 (GOWN DISPOSABLE) ×2 IMPLANT
GOWN STRL REUS W/TWL LRG LVL3 (GOWN DISPOSABLE) ×2
LENS IOL TECNIS ITEC 23.0 (Intraocular Lens) ×2 IMPLANT
PACK CATARACT (MISCELLANEOUS) ×2 IMPLANT
PACK CATARACT KING (MISCELLANEOUS) ×2 IMPLANT
PACK EYE AFTER SURG (MISCELLANEOUS) ×2 IMPLANT
SOL BSS BAG (MISCELLANEOUS) ×2
SOLUTION BSS BAG (MISCELLANEOUS) ×1 IMPLANT
SYR 3ML LL SCALE MARK (SYRINGE) ×4 IMPLANT
SYR 5ML LL (SYRINGE) ×2 IMPLANT
SYR TB 1ML 27GX1/2 LL (SYRINGE) ×2 IMPLANT
WATER STERILE IRR 250ML POUR (IV SOLUTION) ×2 IMPLANT
WIPE NON LINTING 3.25X3.25 (MISCELLANEOUS) ×2 IMPLANT

## 2016-05-01 NOTE — Anesthesia Post-op Follow-up Note (Cosign Needed)
Anesthesia QCDR form completed.        

## 2016-05-01 NOTE — Anesthesia Procedure Notes (Signed)
Procedure Name: MAC Date/Time: 05/01/2016 10:53 AM Performed by: Doreen Salvage Pre-anesthesia Checklist: Patient identified, Emergency Drugs available, Suction available and Patient being monitored Patient Re-evaluated:Patient Re-evaluated prior to inductionOxygen Delivery Method: Nasal cannula

## 2016-05-01 NOTE — Discharge Instructions (Signed)
Eye Surgery Discharge Instructions  Expect mild scratchy sensation or mild soreness. DO NOT RUB YOUR EYE!  The day of surgery:  Minimal physical activity, but bed rest is not required  No reading, computer work, or close hand work  No bending, lifting, or straining.  May watch TV  For 24 hours:  No driving, legal decisions, or alcoholic beverages  Safety precautions  Eat anything you prefer: It is better to start with liquids, then soup then solid foods.  _____ Eye patch should be worn until postoperative exam tomorrow.  ____ Solar shield eyeglasses should be worn for comfort in the sunlight/patch while sleeping  Resume all regular medications including aspirin or Coumadin if these were discontinued prior to surgery. You may shower, bathe, shave, or wash your hair. Tylenol may be taken for mild discomfort.  Call your doctor if you experience significant pain, nausea, or vomiting, fever > 101 or other signs of infection. [phone] or [phone] Specific instructions:  Follow-up Information    Benay Pillow, MD Follow up.   Specialty:  Ophthalmology Why:  March 9 at 8:45am Contact information: [address] [place] [address] [phone]

## 2016-05-01 NOTE — Transfer of Care (Signed)
Immediate Anesthesia Transfer of Care Note  Patient: GARRUS GAUTHREAUX  Procedure(s) Performed: Procedure(s) with comments: CATARACT EXTRACTION PHACO AND INTRAOCULAR LENS PLACEMENT ([place]) (Left) - Lot # [id number] H [place]: 01:35.1 AP%: 11.2 CDE: 11.11  Patient Location: PHASE II  Anesthesia Type:MAC  Level of Consciousness: Awake, Alert, Oriented  Airway & Oxygen Therapy: Patient Spontanous Breathing and Patient on room air   Post-op Assessment: Report given to RN and Post -op Vital signs reviewed and stable  Post vital signs: Reviewed and stable  Last Vitals:  Vitals:   05/01/16 1127 05/01/16 1130  BP: 131/84 131/84  Pulse: 70 65  Resp: 18 14  Temp: 36.2 C 09.2 C    Complications: No apparent anesthesia complications

## 2016-05-01 NOTE — Anesthesia Preprocedure Evaluation (Signed)
Anesthesia Evaluation  Patient identified by MRN, date of birth, ID band Patient awake    Reviewed: Allergy & Precautions, H&P , NPO status , Patient's Chart, lab work & pertinent test results, reviewed documented beta blocker date and time   Airway Mallampati: II  TM Distance: >3 FB Neck ROM: full    Dental no notable dental hx. (+) Teeth Intact   Pulmonary neg pulmonary ROS, sleep apnea , former smoker,    Pulmonary exam normal breath sounds clear to auscultation       Cardiovascular Exercise Tolerance: Good hypertension, negative cardio ROS   Rhythm:regular Rate:Normal     Neuro/Psych PSYCHIATRIC DISORDERS negative neurological ROS  negative psych ROS   GI/Hepatic negative GI ROS, Neg liver ROS, GERD  ,  Endo/Other  negative endocrine ROSdiabetes  Renal/GU Renal disease     Musculoskeletal  (+) Arthritis ,   Abdominal   Peds  Hematology negative hematology ROS (+)   Anesthesia Other Findings   Reproductive/Obstetrics negative OB ROS                             Anesthesia Physical Anesthesia Plan  ASA: III  Anesthesia Plan: MAC   Post-op Pain Management:    Induction:   Airway Management Planned:   Additional Equipment:   Intra-op Plan:   Post-operative Plan:   Informed Consent: I have reviewed the patients History and Physical, chart, labs and discussed the procedure including the risks, benefits and alternatives for the proposed anesthesia with the patient or authorized representative who has indicated his/her understanding and acceptance.     Plan Discussed with: CRNA  Anesthesia Plan Comments:         Anesthesia Quick Evaluation

## 2016-05-01 NOTE — H&P (Signed)
The History and Physical notes are on paper, have been signed, and are to be scanned.   I have examined the patient and there are no changes to the H&P.   Benay Pillow 05/01/2016 10:42 AM

## 2016-05-01 NOTE — Anesthesia Postprocedure Evaluation (Signed)
Anesthesia Post Note  Patient: Mario Robinson  Procedure(s) Performed: Procedure(s) (LRB): CATARACT EXTRACTION PHACO AND INTRAOCULAR LENS PLACEMENT (IOC) (Left)  Patient location during evaluation: PACU Anesthesia Type: MAC Level of consciousness: awake and alert Pain management: pain level controlled Vital Signs Assessment: post-procedure vital signs reviewed and stable Respiratory status: spontaneous breathing, nonlabored ventilation, respiratory function stable and patient connected to nasal cannula oxygen Cardiovascular status: stable and blood pressure returned to baseline Anesthetic complications: no     Last Vitals:  Vitals:   05/01/16 1127 05/01/16 1130  BP: 131/84 131/84  Pulse: 70 65  Resp: 18 14  Temp: 36.2 C 36.2 C    Last Pain:  Vitals:   05/01/16 1130  TempSrc: Temporal                 Alison Stalling

## 2016-05-01 NOTE — Op Note (Signed)
OPERATIVE NOTE  Mario Robinson [id number] 05/01/2016   PREOPERATIVE DIAGNOSIS:  Nuclear sclerotic cataract left eye.  H25.12   POSTOPERATIVE DIAGNOSIS:    Nuclear sclerotic cataract left eye.     PROCEDURE:  Phacoemusification with posterior chamber intraocular lens placement of the left eye   LENS:   Implant Name Type Inv. Item Serial No. Manufacturer Lot No. LRB No. Used  LENS IOL DIOP 23.0 - [id number] Intraocular Lens LENS IOL DIOP 23.0 [id number] AMO   Left 1       PCB00 +23.0   ULTRASOUND TIME: 1 minutes 35 seconds.  CDE 11.11   SURGEON:  Benay Pillow, MD, MPH   ANESTHESIA:  Topical with tetracaine drops augmented with 1% preservative-free intracameral lidocaine.  ESTIMATED BLOOD LOSS: <1 mL   COMPLICATIONS:  None.   DESCRIPTION OF PROCEDURE:  The patient was identified in the holding room and transported to the operating room and placed in the supine position under the operating microscope.  The left eye was identified as the operative eye and it was prepped and draped in the usual sterile ophthalmic fashion.   A 1.0 millimeter clear-corneal paracentesis was made at the 5:00 position. 0.5 ml of preservative-free 1% lidocaine with epinephrine was injected into the anterior chamber.  The anterior chamber was filled with Healon 5 viscoelastic.  A 2.4 millimeter keratome was used to make a near-clear corneal incision at the 2:00 position.  A curvilinear capsulorrhexis was made with a cystotome and capsulorrhexis forceps.  Balanced salt solution was used to hydrodissect and hydrodelineate the nucleus.   Phacoemulsification was then used in stop and chop fashion to remove the lens nucleus and epinucleus.  The remaining cortex was then removed using the irrigation and aspiration handpiece. Healon was then placed into the capsular bag to distend it for lens placement.  A lens was then injected into the capsular bag.  The remaining viscoelastic was aspirated.   Wounds were  hydrated with balanced salt solution.  The anterior chamber was inflated to a physiologic pressure with balanced salt solution.  Intracameral vigamox 0.1 mL undiltued was injected into the eye and a drop placed onto the ocular surface. No wound leaks were noted.  The patient was taken to the recovery room in stable condition without complications of anesthesia or surgery  Benay Pillow 05/01/2016, 11:25 AM

## 2016-05-06 DIAGNOSIS — H2511 Age-related nuclear cataract, right eye: Secondary | ICD-10-CM | POA: Diagnosis not present

## 2016-05-08 ENCOUNTER — Encounter: Payer: Self-pay | Admitting: *Deleted

## 2016-05-13 ENCOUNTER — Encounter: Payer: Self-pay | Admitting: *Deleted

## 2016-05-15 ENCOUNTER — Encounter: Payer: Self-pay | Admitting: *Deleted

## 2016-05-15 ENCOUNTER — Ambulatory Visit
Admission: RE | Admit: 2016-05-15 | Discharge: 2016-05-15 | Disposition: A | Discharge status: Home / Self Care | Payer: PPO | Source: Ambulatory Visit | Attending: Ophthalmology | Admitting: Ophthalmology

## 2016-05-15 ENCOUNTER — Encounter
Admission: RE | Disposition: A | Discharge status: Home / Self Care | Payer: Self-pay | Source: Ambulatory Visit | Attending: Ophthalmology

## 2016-05-15 ENCOUNTER — Ambulatory Visit: Payer: PPO | Admitting: Anesthesiology

## 2016-05-15 DIAGNOSIS — F329 Major depressive disorder, single episode, unspecified: Secondary | ICD-10-CM | POA: Insufficient documentation

## 2016-05-15 DIAGNOSIS — K219 Gastro-esophageal reflux disease without esophagitis: Secondary | ICD-10-CM | POA: Diagnosis not present

## 2016-05-15 DIAGNOSIS — Z87891 Personal history of nicotine dependence: Secondary | ICD-10-CM | POA: Diagnosis not present

## 2016-05-15 DIAGNOSIS — M1990 Unspecified osteoarthritis, unspecified site: Secondary | ICD-10-CM | POA: Insufficient documentation

## 2016-05-15 DIAGNOSIS — E669 Obesity, unspecified: Secondary | ICD-10-CM | POA: Insufficient documentation

## 2016-05-15 DIAGNOSIS — E1136 Type 2 diabetes mellitus with diabetic cataract: Secondary | ICD-10-CM | POA: Diagnosis not present

## 2016-05-15 DIAGNOSIS — E785 Hyperlipidemia, unspecified: Secondary | ICD-10-CM | POA: Insufficient documentation

## 2016-05-15 DIAGNOSIS — E119 Type 2 diabetes mellitus without complications: Secondary | ICD-10-CM | POA: Diagnosis not present

## 2016-05-15 DIAGNOSIS — I10 Essential (primary) hypertension: Secondary | ICD-10-CM | POA: Diagnosis not present

## 2016-05-15 DIAGNOSIS — H2511 Age-related nuclear cataract, right eye: Secondary | ICD-10-CM | POA: Diagnosis not present

## 2016-05-15 DIAGNOSIS — Z6838 Body mass index (BMI) 38.0-38.9, adult: Secondary | ICD-10-CM | POA: Insufficient documentation

## 2016-05-15 DIAGNOSIS — G4730 Sleep apnea, unspecified: Secondary | ICD-10-CM | POA: Insufficient documentation

## 2016-05-15 DIAGNOSIS — I129 Hypertensive chronic kidney disease with stage 1 through stage 4 chronic kidney disease, or unspecified chronic kidney disease: Secondary | ICD-10-CM | POA: Diagnosis not present

## 2016-05-15 DIAGNOSIS — F419 Anxiety disorder, unspecified: Secondary | ICD-10-CM | POA: Diagnosis not present

## 2016-05-15 DIAGNOSIS — N189 Chronic kidney disease, unspecified: Secondary | ICD-10-CM | POA: Diagnosis not present

## 2016-05-15 DIAGNOSIS — Z794 Long term (current) use of insulin: Secondary | ICD-10-CM | POA: Insufficient documentation

## 2016-05-15 HISTORY — PX: CATARACT EXTRACTION W/PHACO: SHX586

## 2016-05-15 LAB — GLUCOSE, CAPILLARY: Glucose-Capillary: 151 mg/dL — ABNORMAL HIGH (ref 65–99)

## 2016-05-15 SURGERY — PHACOEMULSIFICATION, CATARACT, WITH IOL INSERTION
Anesthesia: Monitor Anesthesia Care | Site: Eye | Laterality: Right | Wound class: Clean

## 2016-05-15 MED ORDER — LIDOCAINE HCL (PF) 2 % IJ SOLN
INTRAMUSCULAR | Status: AC
  Filled 2016-05-15: qty 2

## 2016-05-15 MED ORDER — ALFENTANIL 500 MCG/ML IJ INJ
INJECTION | INTRAMUSCULAR | Status: DC | PRN
  Administered 2016-05-15: 500 ug via INTRAVENOUS

## 2016-05-15 MED ORDER — ARMC OPHTHALMIC DILATING DROPS
1.0000 "application " | OPHTHALMIC | Status: AC
  Administered 2016-05-15 (×3): 1 via OPHTHALMIC

## 2016-05-15 MED ORDER — ARMC OPHTHALMIC DILATING DROPS
OPHTHALMIC | Status: AC
  Filled 2016-05-15: qty 0.4

## 2016-05-15 MED ORDER — SODIUM HYALURONATE 23 MG/ML IO SOLN
INTRAOCULAR | Status: AC
  Filled 2016-05-15: qty 0.6

## 2016-05-15 MED ORDER — LIDOCAINE HCL (PF) 4 % IJ SOLN
INTRAOCULAR | Status: DC | PRN
  Administered 2016-05-15: 4 mL via OPHTHALMIC

## 2016-05-15 MED ORDER — EPINEPHRINE PF 1 MG/ML IJ SOLN
INTRAMUSCULAR | Status: AC
  Filled 2016-05-15: qty 2

## 2016-05-15 MED ORDER — MIDAZOLAM HCL 2 MG/2ML IJ SOLN
INTRAMUSCULAR | Status: AC
  Filled 2016-05-15: qty 2

## 2016-05-15 MED ORDER — GLYCOPYRROLATE 0.2 MG/ML IJ SOLN
INTRAMUSCULAR | Status: AC
Start: 2016-05-15 — End: 2016-05-15
  Filled 2016-05-15: qty 1

## 2016-05-15 MED ORDER — PROPOFOL 10 MG/ML IV BOLUS
INTRAVENOUS | Status: AC
  Filled 2016-05-15: qty 20

## 2016-05-15 MED ORDER — MOXIFLOXACIN HCL 0.5 % OP SOLN: 1.0000 [drp] | OPHTHALMIC | Status: DC | PRN

## 2016-05-15 MED ORDER — EPINEPHRINE PF 1 MG/ML IJ SOLN
INTRAMUSCULAR | Status: DC | PRN
  Administered 2016-05-15: 08:00:00 via OPHTHALMIC

## 2016-05-15 MED ORDER — MOXIFLOXACIN HCL 0.5 % OP SOLN
OPHTHALMIC | Status: AC
  Filled 2016-05-15: qty 3

## 2016-05-15 MED ORDER — MOXIFLOXACIN HCL 0.5 % OP SOLN
OPHTHALMIC | Status: DC | PRN
  Administered 2016-05-15: 0.2 mL via OPHTHALMIC

## 2016-05-15 MED ORDER — SODIUM HYALURONATE 10 MG/ML IO SOLN
INTRAOCULAR | Status: DC | PRN
  Administered 2016-05-15: 0.55 mL via INTRAOCULAR

## 2016-05-15 MED ORDER — MIDAZOLAM HCL 2 MG/2ML IJ SOLN
INTRAMUSCULAR | Status: DC | PRN
  Administered 2016-05-15 (×2): 1 mg via INTRAVENOUS

## 2016-05-15 MED ORDER — POVIDONE-IODINE 5 % OP SOLN
OPHTHALMIC | Status: DC | PRN
  Administered 2016-05-15: 1 via OPHTHALMIC

## 2016-05-15 MED ORDER — POVIDONE-IODINE 5 % OP SOLN
OPHTHALMIC | Status: AC
  Filled 2016-05-15: qty 30

## 2016-05-15 MED ORDER — CARBACHOL 0.01 % IO SOLN
INTRAOCULAR | Status: DC | PRN
  Administered 2016-05-15: 0.5 mL via INTRAOCULAR

## 2016-05-15 MED ORDER — SODIUM CHLORIDE 0.9 % IV SOLN
INTRAVENOUS | Status: DC
  Administered 2016-05-15 (×2): via INTRAVENOUS

## 2016-05-15 MED ORDER — SODIUM HYALURONATE 23 MG/ML IO SOLN
INTRAOCULAR | Status: DC | PRN
  Administered 2016-05-15: 0.6 mL via INTRAOCULAR

## 2016-05-15 MED ORDER — SODIUM HYALURONATE 10 MG/ML IO SOLN
INTRAOCULAR | Status: AC
  Filled 2016-05-15: qty 0.85

## 2016-05-15 SURGICAL SUPPLY — 20 items
CANNULA ANT/CHMB 27GA (MISCELLANEOUS) ×4 IMPLANT
CUP MEDICINE 2OZ PLAST GRAD ST (MISCELLANEOUS) ×2 IMPLANT
DISSECTOR HYDRO NUCLEUS 50X22 (MISCELLANEOUS) ×2 IMPLANT
GLOVE BIO SURGEON STRL SZ8 (GLOVE) ×2 IMPLANT
GLOVE BIOGEL M 6.5 STRL (GLOVE) ×2 IMPLANT
GLOVE SURG LX 7.5 STRW (GLOVE) ×1
GLOVE SURG LX STRL 7.5 STRW (GLOVE) ×1 IMPLANT
GOWN STRL REUS W/ TWL LRG LVL3 (GOWN DISPOSABLE) ×2 IMPLANT
GOWN STRL REUS W/TWL LRG LVL3 (GOWN DISPOSABLE) ×2
LENS IOL TECNIS ITEC 23.5 (Intraocular Lens) ×2 IMPLANT
PACK CATARACT (MISCELLANEOUS) ×2 IMPLANT
PACK CATARACT KING (MISCELLANEOUS) ×2 IMPLANT
PACK EYE AFTER SURG (MISCELLANEOUS) ×2 IMPLANT
SOL BSS BAG (MISCELLANEOUS) ×2
SOLUTION BSS BAG (MISCELLANEOUS) ×1 IMPLANT
SYR 3ML LL SCALE MARK (SYRINGE) ×4 IMPLANT
SYR 5ML LL (SYRINGE) ×2 IMPLANT
SYR TB 1ML 27GX1/2 LL (SYRINGE) ×2 IMPLANT
WATER STERILE IRR 250ML POUR (IV SOLUTION) ×2 IMPLANT
WIPE NON LINTING 3.25X3.25 (MISCELLANEOUS) ×2 IMPLANT

## 2016-05-15 NOTE — Anesthesia Post-op Follow-up Note (Cosign Needed)
Anesthesia QCDR form completed.        

## 2016-05-15 NOTE — Discharge Instructions (Signed)
Eye Surgery Discharge Instructions  Expect mild scratchy sensation or mild soreness. DO NOT RUB YOUR EYE!  The day of surgery:  Minimal physical activity, but bed rest is not required  No reading, computer work, or close hand work  No bending, lifting, or straining.  May watch TV  For 24 hours:  No driving, legal decisions, or alcoholic beverages  Safety precautions  Eat anything you prefer: It is better to start with liquids, then soup then solid foods.  _____ Eye patch should be worn until postoperative exam tomorrow.  ____ Solar shield eyeglasses should be worn for comfort in the sunlight/patch while sleeping  Resume all regular medications including aspirin or Coumadin if these were discontinued prior to surgery. You may shower, bathe, shave, or wash your hair. Tylenol may be taken for mild discomfort.  Call your doctor if you experience significant pain, nausea, or vomiting, fever > 101 or other signs of infection. [phone] or [phone] Specific instructions:  Follow-up Information    Benay Pillow, MD Follow up.   Specialty:  Ophthalmology Why:  March 23 at [facility] information: [address] [place] [address] [phone]

## 2016-05-15 NOTE — Op Note (Signed)
OPERATIVE NOTE  Mario Robinson [id number] 05/15/2016   PREOPERATIVE DIAGNOSIS:  Nuclear sclerotic cataract right eye.  H25.11   POSTOPERATIVE DIAGNOSIS:    Nuclear sclerotic cataract right eye.     PROCEDURE:  Phacoemusification with posterior chamber intraocular lens placement of the right eye   LENS:   Implant Name Type Inv. Item Serial No. Manufacturer Lot No. LRB No. Used  LENS IOL DIOP 23.5 - [id number] Intraocular Lens LENS IOL DIOP 23.5 [id number] AMO   Right 1       PCB00 +23.5   ULTRASOUND TIME: 1 minutes 06 seconds.  CDE 7.56   SURGEON:  Benay Pillow, MD, MPH  ANESTHESIOLOGIST: Anesthesiologist: Emmie Niemann, MD CRNA: Courtney Paris, CRNA   ANESTHESIA:  Topical with tetracaine drops augmented with 1% preservative-free intracameral lidocaine.  ESTIMATED BLOOD LOSS: less than 1 mL.   COMPLICATIONS:  None.   DESCRIPTION OF PROCEDURE:  The patient was identified in the holding room and transported to the operating room and placed in the supine position under the operating microscope.  The right eye was identified as the operative eye and it was prepped and draped in the usual sterile ophthalmic fashion.   A 1.0 millimeter clear-corneal paracentesis was made at the 10:30 position. 0.5 ml of preservative-free 1% lidocaine with epinephrine was injected into the anterior chamber.  The anterior chamber was filled with Healon 5 viscoelastic.  A 2.4 millimeter keratome was used to make a near-clear corneal incision at the 8:00 position.  A curvilinear capsulorrhexis was made with a cystotome and capsulorrhexis forceps.  Balanced salt solution was used to hydrodissect and hydrodelineate the nucleus.   Phacoemulsification was then used in stop and chop fashion to remove the lens nucleus and epinucleus.  The remaining cortex was then removed using the irrigation and aspiration handpiece. Healon was then placed into the capsular bag to distend it for lens placement.  A lens was  then injected into the capsular bag.  The remaining viscoelastic was aspirated.   Wounds were hydrated with balanced salt solution.  The anterior chamber was inflated to a physiologic pressure with balanced salt solution.    Intracameral vigamox 0.1 mL undiluted was injected into the eye and a drop placed onto the ocular surface.  No wound leaks were noted.  The patient was taken to the recovery room in stable condition without complications of anesthesia or surgery  Benay Pillow 05/15/2016, 8:13 AM

## 2016-05-15 NOTE — Anesthesia Preprocedure Evaluation (Signed)
Anesthesia Evaluation  Patient identified by MRN, date of birth, ID band Patient awake    Reviewed: Allergy & Precautions, H&P , NPO status , Patient's Chart, lab work & pertinent test results, reviewed documented beta blocker date and time   Airway Mallampati: II  TM Distance: >3 FB Neck ROM: full    Dental no notable dental hx. (+) Teeth Intact   Pulmonary sleep apnea , neg COPD, former smoker,    Pulmonary exam normal breath sounds clear to auscultation- rhonchi (-) wheezing      Cardiovascular Exercise Tolerance: Good hypertension, (-) CAD and (-) Past MI  Rhythm:regular Rate:Normal - Systolic murmurs and - Diastolic murmurs    Neuro/Psych PSYCHIATRIC DISORDERS Anxiety Depression negative neurological ROS     GI/Hepatic Neg liver ROS, GERD  ,  Endo/Other  diabetes, Insulin Dependent  Renal/GU Renal InsufficiencyRenal disease     Musculoskeletal  (+) Arthritis ,   Abdominal (+) + obese,   Peds  Hematology negative hematology ROS (+)   Anesthesia Other Findings Past Medical History: No date: Arthritis No date: Diabetes mellitus No date: GERD (gastroesophageal reflux disease) No date: HLD (hyperlipidemia) No date: HTN (hypertension) No date: Obesity No date: Prostate disorder No date: Sleep apnea   Reproductive/Obstetrics negative OB ROS                             Anesthesia Physical  Anesthesia Plan  ASA: III  Anesthesia Plan: MAC   Post-op Pain Management:    Induction:   Airway Management Planned: Natural Airway  Additional Equipment:   Intra-op Plan:   Post-operative Plan:   Informed Consent: I have reviewed the patients History and Physical, chart, labs and discussed the procedure including the risks, benefits and alternatives for the proposed anesthesia with the patient or authorized representative who has indicated his/her understanding and acceptance.      Plan Discussed with: CRNA and Anesthesiologist  Anesthesia Plan Comments:         Anesthesia Quick Evaluation

## 2016-05-15 NOTE — H&P (Signed)
The History and Physical notes are on paper, have been signed, and are to be scanned.   I have examined the patient and there are no changes to the H&P.   Benay Pillow 05/15/2016 7:32 AM

## 2016-05-15 NOTE — Anesthesia Postprocedure Evaluation (Signed)
Anesthesia Post Note  Patient: Mario Robinson  Procedure(s) Performed: Procedure(s) (LRB): CATARACT EXTRACTION PHACO AND INTRAOCULAR LENS PLACEMENT (IOC) (Right)  Patient location during evaluation: PACU Anesthesia Type: MAC Level of consciousness: awake and alert and oriented Pain management: pain level controlled Vital Signs Assessment: post-procedure vital signs reviewed and stable Respiratory status: spontaneous breathing, nonlabored ventilation and respiratory function stable Cardiovascular status: blood pressure returned to baseline and stable Postop Assessment: no signs of nausea or vomiting Anesthetic complications: no     Last Vitals:  Vitals:   05/15/16 0614 05/15/16 0817  BP: (!) 131/55 131/67  Pulse: (!) 58 60  Resp: 16 16  Temp: 36.7 C 36.9 C    Last Pain:  Vitals:   05/15/16 0614  TempSrc: Tympanic  PainSc: 2                  Latesa Fratto

## 2016-05-15 NOTE — Transfer of Care (Signed)
Immediate Anesthesia Transfer of Care Note  Patient: CAIN FITZHENRY  Procedure(s) Performed: Procedure(s) with comments: CATARACT EXTRACTION PHACO AND INTRAOCULAR LENS PLACEMENT (IOC) (Right) - [place] 1:06.8 AP% 11.0 CDE 7.56 Fluid pack lot # [id number]  Patient Location: PACU and Short Stay  Anesthesia Type:MAC  Level of Consciousness: awake, oriented and patient cooperative  Airway & Oxygen Therapy: Patient Spontanous Breathing  Post-op Assessment: Report given to RN and Post -op Vital signs reviewed and stable  Post vital signs: Reviewed and stable  Last Vitals:  Vitals:   05/15/16 0614  BP: (!) 131/55  Pulse: (!) 58  Resp: 16  Temp: 36.7 C    Last Pain:  Vitals:   05/15/16 0614  TempSrc: Tympanic  PainSc: 2          Complications: No apparent anesthesia complications

## 2016-06-13 DIAGNOSIS — Z961 Presence of intraocular lens: Secondary | ICD-10-CM | POA: Diagnosis not present

## 2016-08-01 DIAGNOSIS — N183 Chronic kidney disease, stage 3 (moderate): Secondary | ICD-10-CM | POA: Diagnosis not present

## 2016-08-01 DIAGNOSIS — Z794 Long term (current) use of insulin: Secondary | ICD-10-CM | POA: Diagnosis not present

## 2016-08-01 DIAGNOSIS — E1122 Type 2 diabetes mellitus with diabetic chronic kidney disease: Secondary | ICD-10-CM | POA: Diagnosis not present

## 2016-08-06 DIAGNOSIS — E1122 Type 2 diabetes mellitus with diabetic chronic kidney disease: Secondary | ICD-10-CM | POA: Diagnosis not present

## 2016-08-06 DIAGNOSIS — Z79899 Other long term (current) drug therapy: Secondary | ICD-10-CM | POA: Diagnosis not present

## 2016-08-06 DIAGNOSIS — Z0000 Encounter for general adult medical examination without abnormal findings: Secondary | ICD-10-CM | POA: Diagnosis not present

## 2016-08-06 DIAGNOSIS — L989 Disorder of the skin and subcutaneous tissue, unspecified: Secondary | ICD-10-CM | POA: Diagnosis not present

## 2016-08-06 DIAGNOSIS — F419 Anxiety disorder, unspecified: Secondary | ICD-10-CM | POA: Diagnosis not present

## 2016-08-06 DIAGNOSIS — E782 Mixed hyperlipidemia: Secondary | ICD-10-CM | POA: Diagnosis not present

## 2016-08-06 DIAGNOSIS — I10 Essential (primary) hypertension: Secondary | ICD-10-CM | POA: Diagnosis not present

## 2016-08-06 DIAGNOSIS — G4733 Obstructive sleep apnea (adult) (pediatric): Secondary | ICD-10-CM | POA: Diagnosis not present

## 2016-08-06 DIAGNOSIS — Z125 Encounter for screening for malignant neoplasm of prostate: Secondary | ICD-10-CM | POA: Diagnosis not present

## 2016-08-06 DIAGNOSIS — K760 Fatty (change of) liver, not elsewhere classified: Secondary | ICD-10-CM | POA: Diagnosis not present

## 2016-08-06 DIAGNOSIS — N183 Chronic kidney disease, stage 3 (moderate): Secondary | ICD-10-CM | POA: Diagnosis not present

## 2016-08-08 DIAGNOSIS — E1122 Type 2 diabetes mellitus with diabetic chronic kidney disease: Secondary | ICD-10-CM | POA: Diagnosis not present

## 2016-08-08 DIAGNOSIS — Z794 Long term (current) use of insulin: Secondary | ICD-10-CM | POA: Diagnosis not present

## 2016-08-08 DIAGNOSIS — E781 Pure hyperglyceridemia: Secondary | ICD-10-CM | POA: Diagnosis not present

## 2016-08-08 DIAGNOSIS — N183 Chronic kidney disease, stage 3 (moderate): Secondary | ICD-10-CM | POA: Diagnosis not present

## 2016-12-02 DIAGNOSIS — I10 Essential (primary) hypertension: Secondary | ICD-10-CM | POA: Diagnosis not present

## 2016-12-02 DIAGNOSIS — E782 Mixed hyperlipidemia: Secondary | ICD-10-CM | POA: Diagnosis not present

## 2016-12-02 DIAGNOSIS — E1122 Type 2 diabetes mellitus with diabetic chronic kidney disease: Secondary | ICD-10-CM | POA: Diagnosis not present

## 2016-12-02 DIAGNOSIS — Z125 Encounter for screening for malignant neoplasm of prostate: Secondary | ICD-10-CM | POA: Diagnosis not present

## 2016-12-02 DIAGNOSIS — N183 Chronic kidney disease, stage 3 (moderate): Secondary | ICD-10-CM | POA: Diagnosis not present

## 2016-12-02 DIAGNOSIS — Z79899 Other long term (current) drug therapy: Secondary | ICD-10-CM | POA: Diagnosis not present

## 2016-12-09 ENCOUNTER — Other Ambulatory Visit: Payer: Self-pay | Admitting: Internal Medicine

## 2016-12-09 DIAGNOSIS — E1122 Type 2 diabetes mellitus with diabetic chronic kidney disease: Secondary | ICD-10-CM | POA: Diagnosis not present

## 2016-12-09 DIAGNOSIS — Z6841 Body Mass Index (BMI) 40.0 and over, adult: Secondary | ICD-10-CM | POA: Diagnosis not present

## 2016-12-09 DIAGNOSIS — Z79899 Other long term (current) drug therapy: Secondary | ICD-10-CM | POA: Diagnosis not present

## 2016-12-09 DIAGNOSIS — G4733 Obstructive sleep apnea (adult) (pediatric): Secondary | ICD-10-CM | POA: Diagnosis not present

## 2016-12-09 DIAGNOSIS — E782 Mixed hyperlipidemia: Secondary | ICD-10-CM | POA: Diagnosis not present

## 2016-12-09 DIAGNOSIS — I10 Essential (primary) hypertension: Secondary | ICD-10-CM | POA: Diagnosis not present

## 2016-12-09 DIAGNOSIS — R1084 Generalized abdominal pain: Secondary | ICD-10-CM | POA: Diagnosis not present

## 2016-12-09 DIAGNOSIS — N183 Chronic kidney disease, stage 3 (moderate): Secondary | ICD-10-CM | POA: Diagnosis not present

## 2016-12-09 DIAGNOSIS — Z125 Encounter for screening for malignant neoplasm of prostate: Secondary | ICD-10-CM | POA: Diagnosis not present

## 2016-12-16 ENCOUNTER — Ambulatory Visit
Admission: RE | Admit: 2016-12-16 | Discharge: 2016-12-16 | Disposition: A | Discharge status: Home / Self Care | Payer: PPO | Source: Ambulatory Visit | Attending: Internal Medicine | Admitting: Internal Medicine

## 2016-12-16 DIAGNOSIS — R932 Abnormal findings on diagnostic imaging of liver and biliary tract: Secondary | ICD-10-CM | POA: Insufficient documentation

## 2016-12-16 DIAGNOSIS — K8020 Calculus of gallbladder without cholecystitis without obstruction: Secondary | ICD-10-CM | POA: Insufficient documentation

## 2016-12-16 DIAGNOSIS — R1084 Generalized abdominal pain: Secondary | ICD-10-CM | POA: Diagnosis not present

## 2016-12-16 DIAGNOSIS — N200 Calculus of kidney: Secondary | ICD-10-CM | POA: Diagnosis not present

## 2016-12-16 DIAGNOSIS — N281 Cyst of kidney, acquired: Secondary | ICD-10-CM | POA: Insufficient documentation

## 2016-12-22 ENCOUNTER — Other Ambulatory Visit: Payer: PPO

## 2016-12-26 ENCOUNTER — Ambulatory Visit: Payer: PPO | Admitting: Urology

## 2017-01-07 DIAGNOSIS — N183 Chronic kidney disease, stage 3 (moderate): Secondary | ICD-10-CM | POA: Diagnosis not present

## 2017-01-07 DIAGNOSIS — E1122 Type 2 diabetes mellitus with diabetic chronic kidney disease: Secondary | ICD-10-CM | POA: Diagnosis not present

## 2017-01-07 DIAGNOSIS — E781 Pure hyperglyceridemia: Secondary | ICD-10-CM | POA: Diagnosis not present

## 2017-01-07 DIAGNOSIS — Z794 Long term (current) use of insulin: Secondary | ICD-10-CM | POA: Diagnosis not present

## 2017-02-13 DIAGNOSIS — B351 Tinea unguium: Secondary | ICD-10-CM | POA: Diagnosis not present

## 2017-02-13 DIAGNOSIS — L218 Other seborrheic dermatitis: Secondary | ICD-10-CM | POA: Diagnosis not present

## 2017-02-13 DIAGNOSIS — L821 Other seborrheic keratosis: Secondary | ICD-10-CM | POA: Diagnosis not present

## 2017-02-13 DIAGNOSIS — B078 Other viral warts: Secondary | ICD-10-CM | POA: Diagnosis not present

## 2017-05-06 DIAGNOSIS — Z794 Long term (current) use of insulin: Secondary | ICD-10-CM | POA: Diagnosis not present

## 2017-05-06 DIAGNOSIS — E1122 Type 2 diabetes mellitus with diabetic chronic kidney disease: Secondary | ICD-10-CM | POA: Diagnosis not present

## 2017-05-06 DIAGNOSIS — N183 Chronic kidney disease, stage 3 (moderate): Secondary | ICD-10-CM | POA: Diagnosis not present

## 2017-05-13 DIAGNOSIS — E1122 Type 2 diabetes mellitus with diabetic chronic kidney disease: Secondary | ICD-10-CM | POA: Diagnosis not present

## 2017-05-13 DIAGNOSIS — N183 Chronic kidney disease, stage 3 (moderate): Secondary | ICD-10-CM | POA: Diagnosis not present

## 2017-05-13 DIAGNOSIS — E781 Pure hyperglyceridemia: Secondary | ICD-10-CM | POA: Diagnosis not present

## 2017-05-13 DIAGNOSIS — Z794 Long term (current) use of insulin: Secondary | ICD-10-CM | POA: Diagnosis not present

## 2017-06-08 DIAGNOSIS — E119 Type 2 diabetes mellitus without complications: Secondary | ICD-10-CM | POA: Diagnosis not present

## 2017-07-31 DIAGNOSIS — N183 Chronic kidney disease, stage 3 (moderate): Secondary | ICD-10-CM | POA: Diagnosis not present

## 2017-07-31 DIAGNOSIS — E1122 Type 2 diabetes mellitus with diabetic chronic kidney disease: Secondary | ICD-10-CM | POA: Diagnosis not present

## 2017-07-31 DIAGNOSIS — I10 Essential (primary) hypertension: Secondary | ICD-10-CM | POA: Diagnosis not present

## 2017-07-31 DIAGNOSIS — Z79899 Other long term (current) drug therapy: Secondary | ICD-10-CM | POA: Diagnosis not present

## 2017-07-31 DIAGNOSIS — Z125 Encounter for screening for malignant neoplasm of prostate: Secondary | ICD-10-CM | POA: Diagnosis not present

## 2017-07-31 DIAGNOSIS — E782 Mixed hyperlipidemia: Secondary | ICD-10-CM | POA: Diagnosis not present

## 2017-08-14 DIAGNOSIS — I10 Essential (primary) hypertension: Secondary | ICD-10-CM | POA: Diagnosis not present

## 2017-08-14 DIAGNOSIS — Z79899 Other long term (current) drug therapy: Secondary | ICD-10-CM | POA: Diagnosis not present

## 2017-08-14 DIAGNOSIS — Z125 Encounter for screening for malignant neoplasm of prostate: Secondary | ICD-10-CM | POA: Diagnosis not present

## 2017-08-14 DIAGNOSIS — G4733 Obstructive sleep apnea (adult) (pediatric): Secondary | ICD-10-CM | POA: Diagnosis not present

## 2017-08-14 DIAGNOSIS — E782 Mixed hyperlipidemia: Secondary | ICD-10-CM | POA: Diagnosis not present

## 2017-08-14 DIAGNOSIS — E1122 Type 2 diabetes mellitus with diabetic chronic kidney disease: Secondary | ICD-10-CM | POA: Diagnosis not present

## 2017-08-14 DIAGNOSIS — Z0000 Encounter for general adult medical examination without abnormal findings: Secondary | ICD-10-CM | POA: Diagnosis not present

## 2017-08-14 DIAGNOSIS — F3341 Major depressive disorder, recurrent, in partial remission: Secondary | ICD-10-CM | POA: Diagnosis not present

## 2017-08-14 DIAGNOSIS — F419 Anxiety disorder, unspecified: Secondary | ICD-10-CM | POA: Diagnosis not present

## 2017-09-08 DIAGNOSIS — E1122 Type 2 diabetes mellitus with diabetic chronic kidney disease: Secondary | ICD-10-CM | POA: Diagnosis not present

## 2017-09-08 DIAGNOSIS — N183 Chronic kidney disease, stage 3 (moderate): Secondary | ICD-10-CM | POA: Diagnosis not present

## 2017-09-08 DIAGNOSIS — Z794 Long term (current) use of insulin: Secondary | ICD-10-CM | POA: Diagnosis not present

## 2017-09-15 DIAGNOSIS — Z794 Long term (current) use of insulin: Secondary | ICD-10-CM | POA: Diagnosis not present

## 2017-09-15 DIAGNOSIS — E781 Pure hyperglyceridemia: Secondary | ICD-10-CM | POA: Diagnosis not present

## 2017-09-15 DIAGNOSIS — E669 Obesity, unspecified: Secondary | ICD-10-CM | POA: Diagnosis not present

## 2017-09-15 DIAGNOSIS — N184 Chronic kidney disease, stage 4 (severe): Secondary | ICD-10-CM | POA: Diagnosis not present

## 2017-09-15 DIAGNOSIS — E1169 Type 2 diabetes mellitus with other specified complication: Secondary | ICD-10-CM | POA: Diagnosis not present

## 2017-09-15 DIAGNOSIS — E1122 Type 2 diabetes mellitus with diabetic chronic kidney disease: Secondary | ICD-10-CM | POA: Diagnosis not present

## 2017-09-28 ENCOUNTER — Emergency Department: Payer: PRIVATE HEALTH INSURANCE

## 2017-09-28 ENCOUNTER — Encounter: Payer: Self-pay | Admitting: Emergency Medicine

## 2017-09-28 ENCOUNTER — Emergency Department
Admission: EM | Admit: 2017-09-28 | Discharge: 2017-09-28 | Disposition: A | Discharge status: Home / Self Care | Payer: PRIVATE HEALTH INSURANCE | Attending: Emergency Medicine | Admitting: Emergency Medicine

## 2017-09-28 ENCOUNTER — Other Ambulatory Visit: Payer: Self-pay

## 2017-09-28 DIAGNOSIS — I129 Hypertensive chronic kidney disease with stage 1 through stage 4 chronic kidney disease, or unspecified chronic kidney disease: Secondary | ICD-10-CM | POA: Diagnosis not present

## 2017-09-28 DIAGNOSIS — T148XXA Other injury of unspecified body region, initial encounter: Secondary | ICD-10-CM

## 2017-09-28 DIAGNOSIS — Y9389 Activity, other specified: Secondary | ICD-10-CM | POA: Insufficient documentation

## 2017-09-28 DIAGNOSIS — S0990XA Unspecified injury of head, initial encounter: Secondary | ICD-10-CM | POA: Diagnosis not present

## 2017-09-28 DIAGNOSIS — Y999 Unspecified external cause status: Secondary | ICD-10-CM | POA: Insufficient documentation

## 2017-09-28 DIAGNOSIS — N183 Chronic kidney disease, stage 3 (moderate): Secondary | ICD-10-CM | POA: Insufficient documentation

## 2017-09-28 DIAGNOSIS — M25519 Pain in unspecified shoulder: Secondary | ICD-10-CM | POA: Diagnosis not present

## 2017-09-28 DIAGNOSIS — Z7984 Long term (current) use of oral hypoglycemic drugs: Secondary | ICD-10-CM | POA: Insufficient documentation

## 2017-09-28 DIAGNOSIS — Y92410 Unspecified street and highway as the place of occurrence of the external cause: Secondary | ICD-10-CM | POA: Diagnosis not present

## 2017-09-28 DIAGNOSIS — S161XXA Strain of muscle, fascia and tendon at neck level, initial encounter: Secondary | ICD-10-CM | POA: Insufficient documentation

## 2017-09-28 DIAGNOSIS — S199XXA Unspecified injury of neck, initial encounter: Secondary | ICD-10-CM | POA: Diagnosis present

## 2017-09-28 DIAGNOSIS — Z79899 Other long term (current) drug therapy: Secondary | ICD-10-CM | POA: Insufficient documentation

## 2017-09-28 DIAGNOSIS — Z87891 Personal history of nicotine dependence: Secondary | ICD-10-CM | POA: Insufficient documentation

## 2017-09-28 DIAGNOSIS — R001 Bradycardia, unspecified: Secondary | ICD-10-CM | POA: Diagnosis not present

## 2017-09-28 DIAGNOSIS — E1122 Type 2 diabetes mellitus with diabetic chronic kidney disease: Secondary | ICD-10-CM | POA: Diagnosis not present

## 2017-09-28 DIAGNOSIS — M79661 Pain in right lower leg: Secondary | ICD-10-CM | POA: Diagnosis not present

## 2017-09-28 LAB — GLUCOSE, CAPILLARY: Glucose-Capillary: 112 mg/dL — ABNORMAL HIGH (ref 70–99)

## 2017-09-28 MED ORDER — ACETAMINOPHEN 500 MG PO TABS
1000.0000 mg | ORAL_TABLET | Freq: Once | ORAL | Status: AC
  Administered 2017-09-28: 1000 mg via ORAL
  Filled 2017-09-28: qty 2

## 2017-09-28 MED ORDER — MELOXICAM 15 MG PO TABS: 15.0000 mg | ORAL_TABLET | Freq: Every day | ORAL | 2 refills | Status: AC

## 2017-09-28 NOTE — ED Provider Notes (Signed)
EKG interpreted by me Sinus bradycardia rate of 57, normal axis.  Slight first-degree AV block with a PR interval 202 ms.  Normal QRS ST segments and T waves.  No ischemic changes.   Carrie Mew, MD 09/28/17 1110
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
## 2017-09-28 NOTE — Discharge Instructions (Addendum)
Follow-up with your regular doctor if not better in 3 to 5 days.  Take Tylenol and the meloxicam as needed for pain.  She will apply ice for the next 3 days to any areas that hurt.  After 3 days she may use wet heat such as a hot wet towel across her shoulders and neck followed by ice.  If you are worsening please return to emergency department.  If you have increasing headaches please return to the emergency department.  See your regular doctor if you feel you need physical therapy.

## 2017-09-28 NOTE — ED Notes (Signed)
Vaughan Basta McLamb came to get patient, taken to Rm 44.  Linda aware that triage is incomplete.
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
## 2017-10-15 DIAGNOSIS — E1129 Type 2 diabetes mellitus with other diabetic kidney complication: Secondary | ICD-10-CM | POA: Diagnosis not present

## 2017-10-15 DIAGNOSIS — I10 Essential (primary) hypertension: Secondary | ICD-10-CM | POA: Diagnosis not present

## 2017-10-15 DIAGNOSIS — R809 Proteinuria, unspecified: Secondary | ICD-10-CM | POA: Diagnosis not present

## 2017-10-15 DIAGNOSIS — N184 Chronic kidney disease, stage 4 (severe): Secondary | ICD-10-CM | POA: Diagnosis not present

## 2017-11-12 DIAGNOSIS — L918 Other hypertrophic disorders of the skin: Secondary | ICD-10-CM | POA: Diagnosis not present

## 2017-11-12 DIAGNOSIS — L821 Other seborrheic keratosis: Secondary | ICD-10-CM | POA: Diagnosis not present

## 2017-11-12 DIAGNOSIS — D1801 Hemangioma of skin and subcutaneous tissue: Secondary | ICD-10-CM | POA: Diagnosis not present

## 2017-11-12 DIAGNOSIS — L218 Other seborrheic dermatitis: Secondary | ICD-10-CM | POA: Diagnosis not present

## 2017-12-18 DIAGNOSIS — E1169 Type 2 diabetes mellitus with other specified complication: Secondary | ICD-10-CM | POA: Diagnosis not present

## 2017-12-18 DIAGNOSIS — E669 Obesity, unspecified: Secondary | ICD-10-CM | POA: Diagnosis not present

## 2017-12-25 DIAGNOSIS — N184 Chronic kidney disease, stage 4 (severe): Secondary | ICD-10-CM | POA: Diagnosis not present

## 2017-12-25 DIAGNOSIS — E1122 Type 2 diabetes mellitus with diabetic chronic kidney disease: Secondary | ICD-10-CM | POA: Diagnosis not present

## 2017-12-25 DIAGNOSIS — E669 Obesity, unspecified: Secondary | ICD-10-CM | POA: Diagnosis not present

## 2017-12-25 DIAGNOSIS — E1159 Type 2 diabetes mellitus with other circulatory complications: Secondary | ICD-10-CM | POA: Diagnosis not present

## 2017-12-25 DIAGNOSIS — E1169 Type 2 diabetes mellitus with other specified complication: Secondary | ICD-10-CM | POA: Diagnosis not present

## 2017-12-25 DIAGNOSIS — Z794 Long term (current) use of insulin: Secondary | ICD-10-CM | POA: Diagnosis not present

## 2017-12-25 DIAGNOSIS — E781 Pure hyperglyceridemia: Secondary | ICD-10-CM | POA: Diagnosis not present

## 2018-01-14 DIAGNOSIS — I129 Hypertensive chronic kidney disease with stage 1 through stage 4 chronic kidney disease, or unspecified chronic kidney disease: Secondary | ICD-10-CM | POA: Diagnosis not present

## 2018-01-14 DIAGNOSIS — N184 Chronic kidney disease, stage 4 (severe): Secondary | ICD-10-CM | POA: Diagnosis not present

## 2018-01-14 DIAGNOSIS — E1129 Type 2 diabetes mellitus with other diabetic kidney complication: Secondary | ICD-10-CM | POA: Diagnosis not present

## 2018-01-26 DIAGNOSIS — R972 Elevated prostate specific antigen [PSA]: Secondary | ICD-10-CM | POA: Insufficient documentation

## 2018-02-02 DIAGNOSIS — E1129 Type 2 diabetes mellitus with other diabetic kidney complication: Secondary | ICD-10-CM | POA: Diagnosis not present

## 2018-02-02 DIAGNOSIS — Z79899 Other long term (current) drug therapy: Secondary | ICD-10-CM | POA: Diagnosis not present

## 2018-02-02 DIAGNOSIS — N183 Chronic kidney disease, stage 3 (moderate): Secondary | ICD-10-CM | POA: Diagnosis not present

## 2018-02-02 DIAGNOSIS — I10 Essential (primary) hypertension: Secondary | ICD-10-CM | POA: Diagnosis not present

## 2018-02-02 DIAGNOSIS — I129 Hypertensive chronic kidney disease with stage 1 through stage 4 chronic kidney disease, or unspecified chronic kidney disease: Secondary | ICD-10-CM | POA: Diagnosis not present

## 2018-02-02 DIAGNOSIS — G4733 Obstructive sleep apnea (adult) (pediatric): Secondary | ICD-10-CM | POA: Diagnosis not present

## 2018-02-02 DIAGNOSIS — I2510 Atherosclerotic heart disease of native coronary artery without angina pectoris: Secondary | ICD-10-CM | POA: Diagnosis not present

## 2018-02-02 DIAGNOSIS — E669 Obesity, unspecified: Secondary | ICD-10-CM | POA: Diagnosis not present

## 2018-02-02 DIAGNOSIS — R972 Elevated prostate specific antigen [PSA]: Secondary | ICD-10-CM | POA: Diagnosis not present

## 2018-02-02 DIAGNOSIS — E782 Mixed hyperlipidemia: Secondary | ICD-10-CM | POA: Diagnosis not present

## 2018-02-09 DIAGNOSIS — I10 Essential (primary) hypertension: Secondary | ICD-10-CM | POA: Diagnosis not present

## 2018-02-09 DIAGNOSIS — E1122 Type 2 diabetes mellitus with diabetic chronic kidney disease: Secondary | ICD-10-CM | POA: Diagnosis not present

## 2018-02-09 DIAGNOSIS — E782 Mixed hyperlipidemia: Secondary | ICD-10-CM | POA: Diagnosis not present

## 2018-02-09 DIAGNOSIS — R972 Elevated prostate specific antigen [PSA]: Secondary | ICD-10-CM | POA: Diagnosis not present

## 2018-02-09 DIAGNOSIS — Z79899 Other long term (current) drug therapy: Secondary | ICD-10-CM | POA: Diagnosis not present

## 2018-02-09 DIAGNOSIS — N183 Chronic kidney disease, stage 3 (moderate): Secondary | ICD-10-CM | POA: Diagnosis not present

## 2018-02-09 DIAGNOSIS — G4733 Obstructive sleep apnea (adult) (pediatric): Secondary | ICD-10-CM | POA: Diagnosis not present

## 2018-02-12 DIAGNOSIS — L821 Other seborrheic keratosis: Secondary | ICD-10-CM | POA: Diagnosis not present

## 2018-08-17 DIAGNOSIS — E782 Mixed hyperlipidemia: Secondary | ICD-10-CM | POA: Diagnosis not present

## 2018-08-17 DIAGNOSIS — Z79899 Other long term (current) drug therapy: Secondary | ICD-10-CM | POA: Diagnosis not present

## 2018-08-17 DIAGNOSIS — E1159 Type 2 diabetes mellitus with other circulatory complications: Secondary | ICD-10-CM | POA: Diagnosis not present

## 2018-08-17 DIAGNOSIS — I10 Essential (primary) hypertension: Secondary | ICD-10-CM | POA: Diagnosis not present

## 2018-08-24 DIAGNOSIS — R0600 Dyspnea, unspecified: Secondary | ICD-10-CM | POA: Diagnosis not present

## 2018-08-24 DIAGNOSIS — N184 Chronic kidney disease, stage 4 (severe): Secondary | ICD-10-CM | POA: Diagnosis not present

## 2018-08-24 DIAGNOSIS — Z794 Long term (current) use of insulin: Secondary | ICD-10-CM | POA: Diagnosis not present

## 2018-08-24 DIAGNOSIS — E1159 Type 2 diabetes mellitus with other circulatory complications: Secondary | ICD-10-CM | POA: Diagnosis not present

## 2018-08-24 DIAGNOSIS — I10 Essential (primary) hypertension: Secondary | ICD-10-CM | POA: Diagnosis not present

## 2018-08-24 DIAGNOSIS — N183 Chronic kidney disease, stage 3 (moderate): Secondary | ICD-10-CM | POA: Diagnosis not present

## 2018-08-24 DIAGNOSIS — Z20828 Contact with and (suspected) exposure to other viral communicable diseases: Secondary | ICD-10-CM | POA: Diagnosis not present

## 2018-08-24 DIAGNOSIS — Z0000 Encounter for general adult medical examination without abnormal findings: Secondary | ICD-10-CM | POA: Diagnosis not present

## 2018-08-24 DIAGNOSIS — E782 Mixed hyperlipidemia: Secondary | ICD-10-CM | POA: Diagnosis not present

## 2018-08-24 DIAGNOSIS — E1169 Type 2 diabetes mellitus with other specified complication: Secondary | ICD-10-CM | POA: Diagnosis not present

## 2018-08-24 DIAGNOSIS — Z1211 Encounter for screening for malignant neoplasm of colon: Secondary | ICD-10-CM | POA: Diagnosis not present

## 2018-08-24 DIAGNOSIS — E1122 Type 2 diabetes mellitus with diabetic chronic kidney disease: Secondary | ICD-10-CM | POA: Diagnosis not present

## 2018-08-24 DIAGNOSIS — E669 Obesity, unspecified: Secondary | ICD-10-CM | POA: Diagnosis not present

## 2018-08-24 DIAGNOSIS — G4733 Obstructive sleep apnea (adult) (pediatric): Secondary | ICD-10-CM | POA: Diagnosis not present

## 2018-09-16 DIAGNOSIS — I10 Essential (primary) hypertension: Secondary | ICD-10-CM | POA: Diagnosis not present

## 2018-09-16 DIAGNOSIS — G4733 Obstructive sleep apnea (adult) (pediatric): Secondary | ICD-10-CM | POA: Diagnosis not present

## 2018-09-16 DIAGNOSIS — R0600 Dyspnea, unspecified: Secondary | ICD-10-CM | POA: Diagnosis not present

## 2018-10-14 DIAGNOSIS — R194 Change in bowel habit: Secondary | ICD-10-CM | POA: Insufficient documentation

## 2018-10-14 DIAGNOSIS — D649 Anemia, unspecified: Secondary | ICD-10-CM | POA: Diagnosis not present

## 2018-10-14 DIAGNOSIS — N183 Chronic kidney disease, stage 3 (moderate): Secondary | ICD-10-CM | POA: Diagnosis not present

## 2018-10-14 DIAGNOSIS — K760 Fatty (change of) liver, not elsewhere classified: Secondary | ICD-10-CM | POA: Diagnosis not present

## 2018-10-14 DIAGNOSIS — Z86010 Personal history of colonic polyps: Secondary | ICD-10-CM | POA: Diagnosis not present

## 2018-10-14 DIAGNOSIS — Z800 Family history of malignant neoplasm of digestive organs: Secondary | ICD-10-CM | POA: Diagnosis not present

## 2018-10-15 ENCOUNTER — Other Ambulatory Visit: Payer: Self-pay | Admitting: Nurse Practitioner

## 2018-10-15 DIAGNOSIS — K760 Fatty (change of) liver, not elsewhere classified: Secondary | ICD-10-CM

## 2018-10-20 ENCOUNTER — Ambulatory Visit: Payer: PRIVATE HEALTH INSURANCE

## 2018-10-21 ENCOUNTER — Other Ambulatory Visit: Payer: Self-pay

## 2018-10-21 ENCOUNTER — Ambulatory Visit
Admission: RE | Admit: 2018-10-21 | Discharge: 2018-10-21 | Disposition: A | Discharge status: Home / Self Care | Payer: Medicare HMO | Source: Ambulatory Visit | Attending: Nurse Practitioner | Admitting: Nurse Practitioner

## 2018-10-21 DIAGNOSIS — K760 Fatty (change of) liver, not elsewhere classified: Secondary | ICD-10-CM

## 2018-11-11 DIAGNOSIS — E1129 Type 2 diabetes mellitus with other diabetic kidney complication: Secondary | ICD-10-CM | POA: Diagnosis not present

## 2018-11-11 DIAGNOSIS — N183 Chronic kidney disease, stage 3 (moderate): Secondary | ICD-10-CM | POA: Diagnosis not present

## 2018-11-11 DIAGNOSIS — G4733 Obstructive sleep apnea (adult) (pediatric): Secondary | ICD-10-CM | POA: Diagnosis not present

## 2018-11-11 DIAGNOSIS — I129 Hypertensive chronic kidney disease with stage 1 through stage 4 chronic kidney disease, or unspecified chronic kidney disease: Secondary | ICD-10-CM | POA: Diagnosis not present

## 2018-11-11 DIAGNOSIS — I2510 Atherosclerotic heart disease of native coronary artery without angina pectoris: Secondary | ICD-10-CM | POA: Diagnosis not present

## 2018-11-11 DIAGNOSIS — E669 Obesity, unspecified: Secondary | ICD-10-CM | POA: Diagnosis not present

## 2018-11-22 DIAGNOSIS — I2510 Atherosclerotic heart disease of native coronary artery without angina pectoris: Secondary | ICD-10-CM | POA: Insufficient documentation

## 2018-11-23 DIAGNOSIS — E1129 Type 2 diabetes mellitus with other diabetic kidney complication: Secondary | ICD-10-CM | POA: Diagnosis not present

## 2018-11-23 DIAGNOSIS — R600 Localized edema: Secondary | ICD-10-CM | POA: Diagnosis not present

## 2018-11-23 DIAGNOSIS — N183 Chronic kidney disease, stage 3 (moderate): Secondary | ICD-10-CM | POA: Diagnosis not present

## 2018-11-23 DIAGNOSIS — N400 Enlarged prostate without lower urinary tract symptoms: Secondary | ICD-10-CM | POA: Diagnosis not present

## 2018-11-23 DIAGNOSIS — E669 Obesity, unspecified: Secondary | ICD-10-CM | POA: Diagnosis not present

## 2018-11-23 DIAGNOSIS — G4733 Obstructive sleep apnea (adult) (pediatric): Secondary | ICD-10-CM | POA: Diagnosis not present

## 2018-12-15 DIAGNOSIS — E119 Type 2 diabetes mellitus without complications: Secondary | ICD-10-CM | POA: Diagnosis not present

## 2018-12-29 DIAGNOSIS — Z87891 Personal history of nicotine dependence: Secondary | ICD-10-CM | POA: Diagnosis not present

## 2018-12-29 DIAGNOSIS — I10 Essential (primary) hypertension: Secondary | ICD-10-CM | POA: Diagnosis not present

## 2018-12-29 DIAGNOSIS — E1122 Type 2 diabetes mellitus with diabetic chronic kidney disease: Secondary | ICD-10-CM | POA: Diagnosis not present

## 2018-12-29 DIAGNOSIS — E782 Mixed hyperlipidemia: Secondary | ICD-10-CM | POA: Diagnosis not present

## 2018-12-29 DIAGNOSIS — R972 Elevated prostate specific antigen [PSA]: Secondary | ICD-10-CM | POA: Diagnosis not present

## 2018-12-29 DIAGNOSIS — N1832 Chronic kidney disease, stage 3b: Secondary | ICD-10-CM | POA: Diagnosis not present

## 2018-12-29 DIAGNOSIS — Z79899 Other long term (current) drug therapy: Secondary | ICD-10-CM | POA: Diagnosis not present

## 2018-12-30 DIAGNOSIS — I10 Essential (primary) hypertension: Secondary | ICD-10-CM | POA: Diagnosis not present

## 2018-12-30 DIAGNOSIS — Z79899 Other long term (current) drug therapy: Secondary | ICD-10-CM | POA: Diagnosis not present

## 2018-12-30 DIAGNOSIS — E1159 Type 2 diabetes mellitus with other circulatory complications: Secondary | ICD-10-CM | POA: Diagnosis not present

## 2018-12-30 DIAGNOSIS — R972 Elevated prostate specific antigen [PSA]: Secondary | ICD-10-CM | POA: Diagnosis not present

## 2018-12-30 DIAGNOSIS — E782 Mixed hyperlipidemia: Secondary | ICD-10-CM | POA: Diagnosis not present

## 2019-01-06 DIAGNOSIS — E1121 Type 2 diabetes mellitus with diabetic nephropathy: Secondary | ICD-10-CM | POA: Diagnosis not present

## 2019-01-06 DIAGNOSIS — N1832 Chronic kidney disease, stage 3b: Secondary | ICD-10-CM | POA: Diagnosis not present

## 2019-01-06 DIAGNOSIS — Z794 Long term (current) use of insulin: Secondary | ICD-10-CM | POA: Diagnosis not present

## 2019-01-06 DIAGNOSIS — E669 Obesity, unspecified: Secondary | ICD-10-CM | POA: Diagnosis not present

## 2019-01-06 DIAGNOSIS — E1159 Type 2 diabetes mellitus with other circulatory complications: Secondary | ICD-10-CM | POA: Diagnosis not present

## 2019-01-06 DIAGNOSIS — E1169 Type 2 diabetes mellitus with other specified complication: Secondary | ICD-10-CM | POA: Diagnosis not present

## 2019-01-28 DIAGNOSIS — Z20828 Contact with and (suspected) exposure to other viral communicable diseases: Secondary | ICD-10-CM | POA: Diagnosis not present

## 2019-01-31 IMAGING — US US ABDOMEN COMPLETE
1 series · 13 of 25 positions shown · non-contrast
Comparison: October 25, 2008

CLINICAL DATA: Chronic abdominal pain

EXAM:
ABDOMEN ULTRASOUND COMPLETE

[Series 1: us abdomen complete · 0.23mm/px · 13 of 96 slices shown]
[im 1/96]
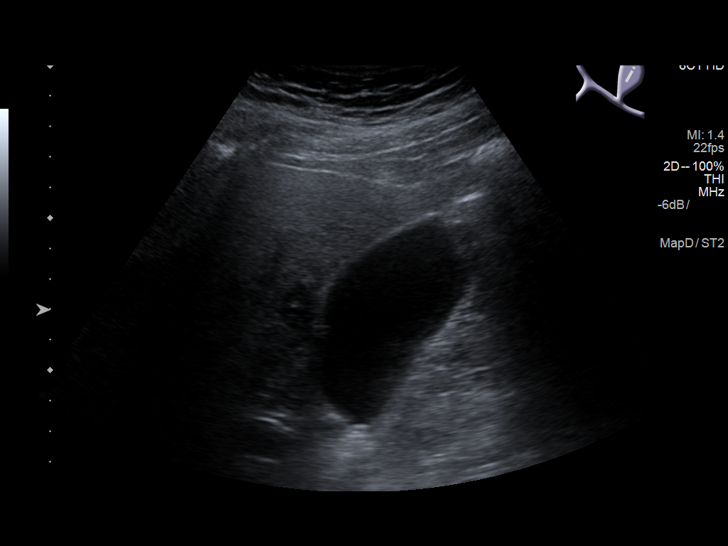
[im 8/96]
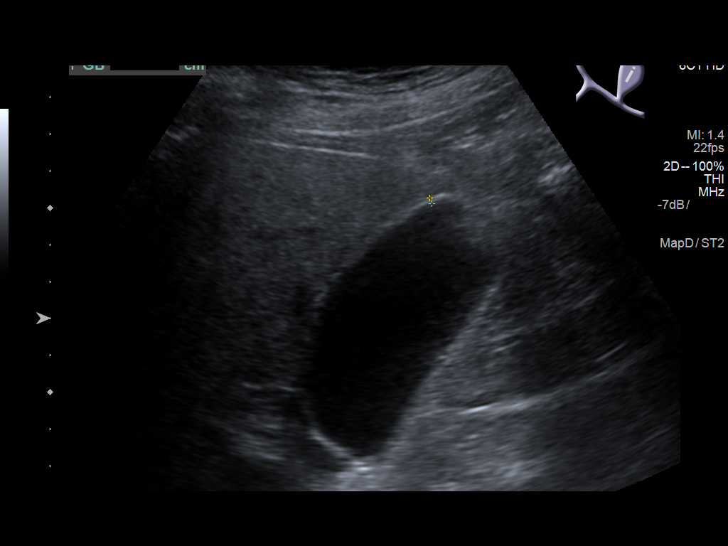
[im 16/96]
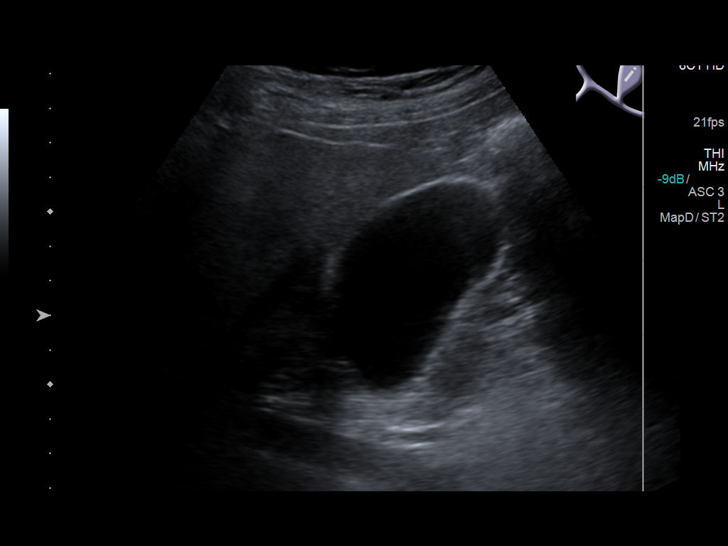
[im 24/96]
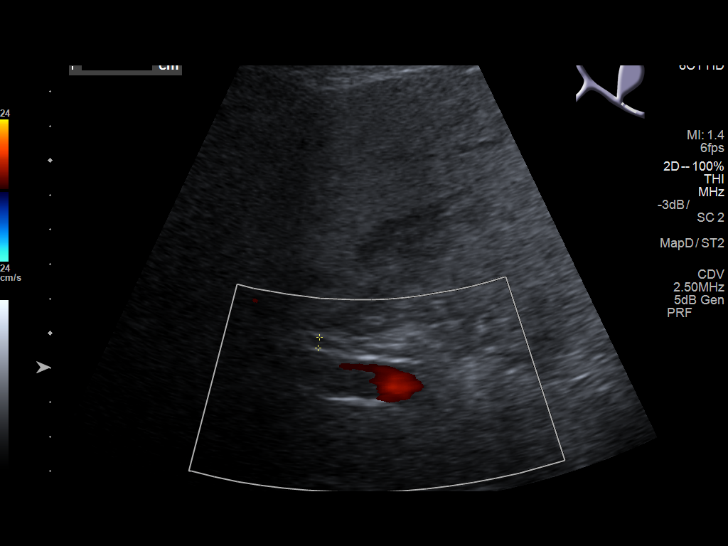
[im 32/96]
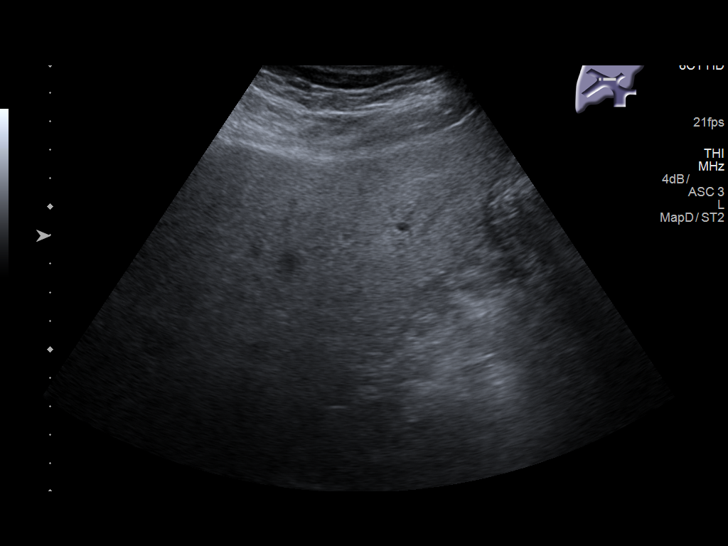
[im 40/96]
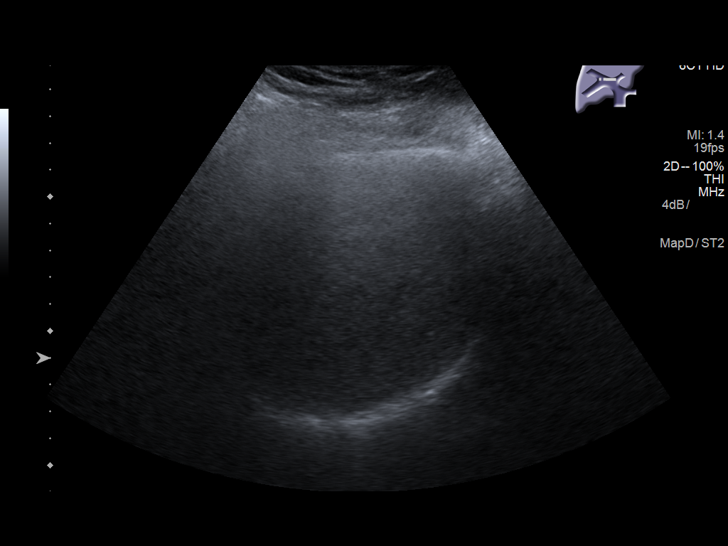
[im 48/96]
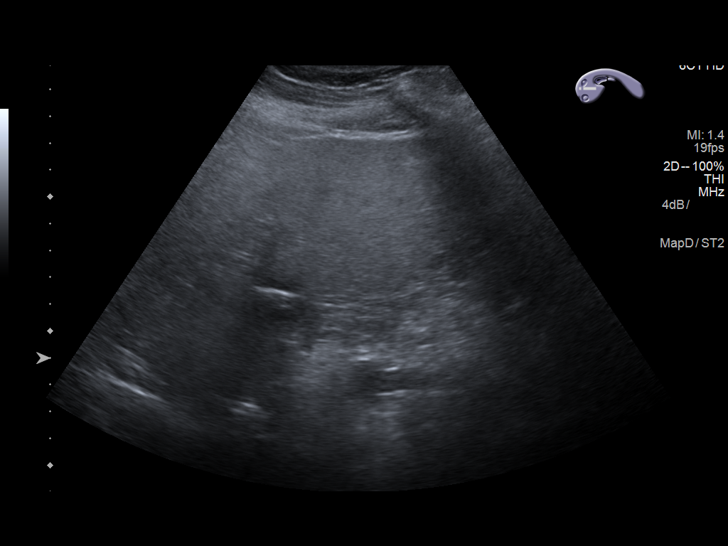
[im 56/96]
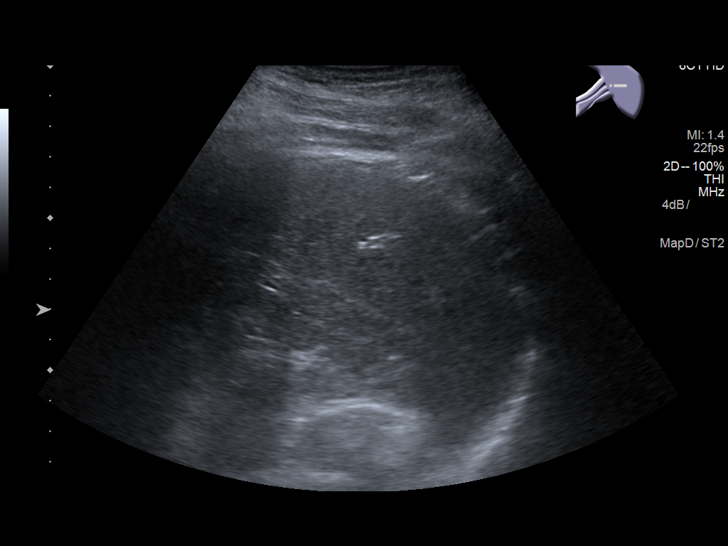
[im 64/96]
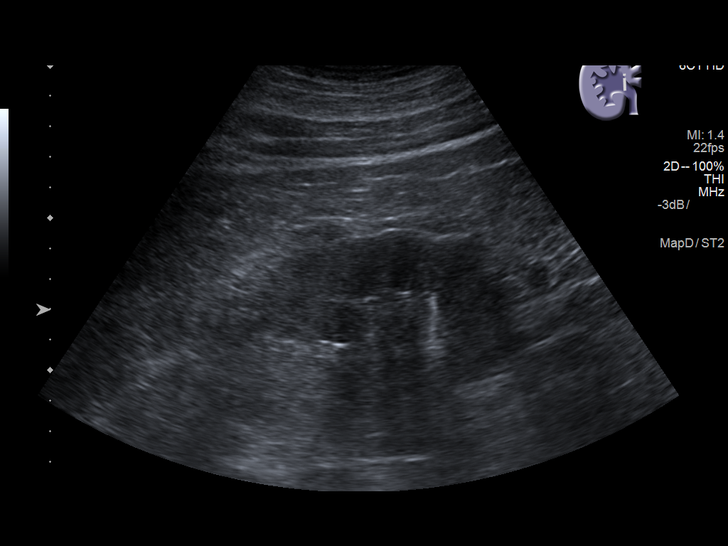
[im 72/96]
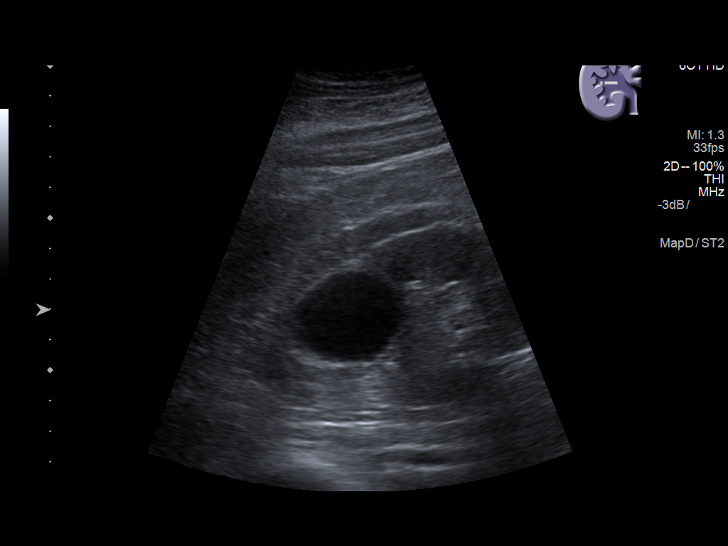
[im 80/96]
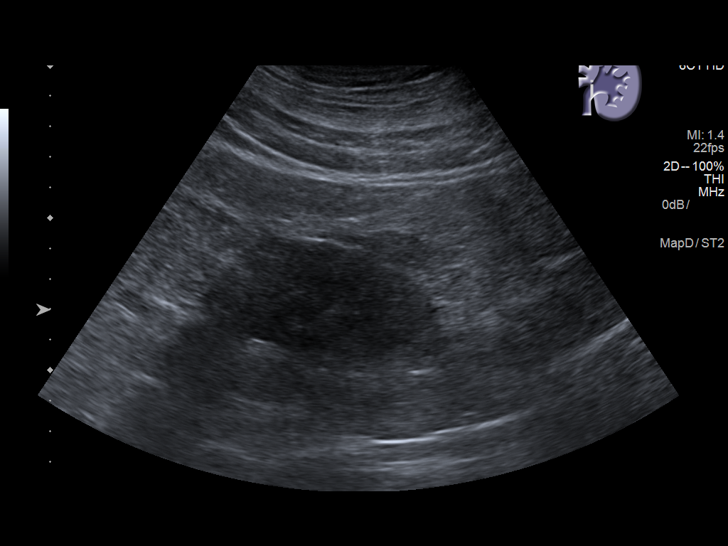
[im 88/96]
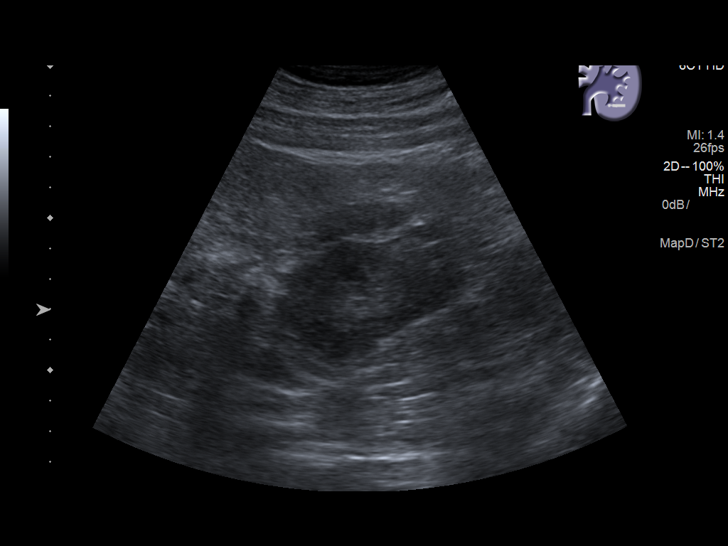
[im 96/96]
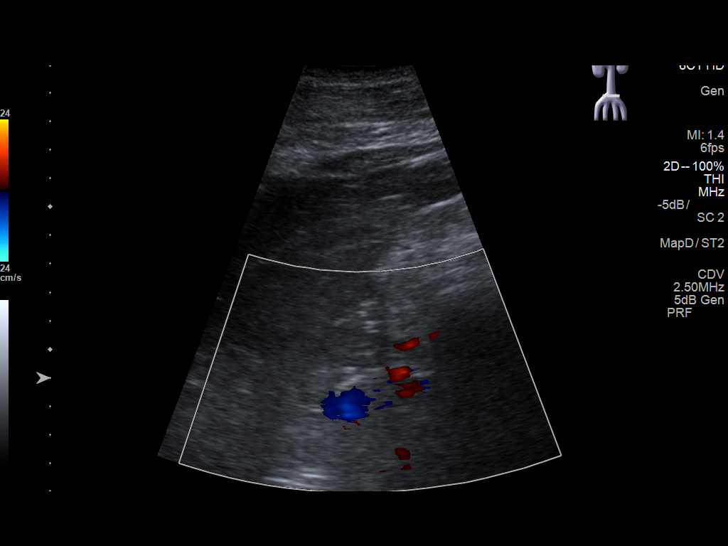

[13 of 25 positions shown; findings below may reference images not displayed]

FINDINGS: Gallbladder: Within the gallbladder, there are several small
echogenic foci which move and shadow consistent with cholelithiasis.
Largest individual gallstone measures 5 mm. There is no appreciable
gallbladder wall thickening or pericholecystic fluid. No sonographic
Murphy sign noted by sonographer.

Common bile duct: Diameter: 3 mm. There is no intrahepatic, common
hepatic, or common bile duct dilatation.

Liver: No focal lesion identified. Liver echogenicity overall is
increased. There is apparent fatty sparing near the gallbladder
fossa. Portal vein is patent on color Doppler imaging with normal
direction of blood flow towards the liver.

IVC: No abnormality visualized.

Pancreas: Visualized portion unremarkable. Portions of pancreas
obscured by gas.

Spleen: Size and appearance within normal limits.

Right Kidney: Length: 11.6 cm. Echogenicity within normal limits. No
hydronephrosis visualized. There is a cyst in the mid right kidney
measuring 4.0 x 3.0 x 3.9 cm

Left Kidney: Length: 11.8 cm. Echogenicity within normal limits. No
mass or hydronephrosis visualized.

Abdominal aorta: No aneurysm visualized.

Other findings: No demonstrable ascites.
IMPRESSION: 1. Cholelithiasis. No gallbladder wall thickening or pericholecystic
fluid.

2. Increased liver echogenicity, a finding indicative of hepatic
steatosis. While no focal liver lesions are evident on this study,
it must be cautioned that the sensitivity of ultrasound for
detection of focal liver lesions is diminished in this circumstance.

3. Portions of pancreas obscured by gas. Visualized portions of
pancreas appear normal.

4.  Cyst mid right kidney.

5.  Study otherwise unremarkable.

## 2019-02-07 ENCOUNTER — Ambulatory Visit: Admit: 2019-02-07 | Payer: Medicare HMO | Admitting: Internal Medicine

## 2019-02-07 SURGERY — COLONOSCOPY WITH PROPOFOL
Anesthesia: General

## 2019-03-23 DIAGNOSIS — E669 Obesity, unspecified: Secondary | ICD-10-CM | POA: Diagnosis not present

## 2019-03-23 DIAGNOSIS — E1169 Type 2 diabetes mellitus with other specified complication: Secondary | ICD-10-CM | POA: Diagnosis not present

## 2019-03-30 DIAGNOSIS — E1121 Type 2 diabetes mellitus with diabetic nephropathy: Secondary | ICD-10-CM | POA: Diagnosis not present

## 2019-03-30 DIAGNOSIS — N1832 Chronic kidney disease, stage 3b: Secondary | ICD-10-CM | POA: Diagnosis not present

## 2019-03-30 DIAGNOSIS — Z794 Long term (current) use of insulin: Secondary | ICD-10-CM | POA: Diagnosis not present

## 2019-03-30 DIAGNOSIS — E669 Obesity, unspecified: Secondary | ICD-10-CM | POA: Diagnosis not present

## 2019-03-30 DIAGNOSIS — E1159 Type 2 diabetes mellitus with other circulatory complications: Secondary | ICD-10-CM | POA: Diagnosis not present

## 2019-03-30 DIAGNOSIS — E1169 Type 2 diabetes mellitus with other specified complication: Secondary | ICD-10-CM | POA: Diagnosis not present
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
## 2019-05-11 DIAGNOSIS — E119 Type 2 diabetes mellitus without complications: Secondary | ICD-10-CM | POA: Diagnosis not present

## 2019-05-11 DIAGNOSIS — Z794 Long term (current) use of insulin: Secondary | ICD-10-CM | POA: Diagnosis not present

## 2019-05-11 DIAGNOSIS — Z79899 Other long term (current) drug therapy: Secondary | ICD-10-CM | POA: Diagnosis not present

## 2019-05-11 DIAGNOSIS — E785 Hyperlipidemia, unspecified: Secondary | ICD-10-CM | POA: Diagnosis not present

## 2019-05-11 DIAGNOSIS — Z0000 Encounter for general adult medical examination without abnormal findings: Secondary | ICD-10-CM | POA: Diagnosis not present

## 2019-05-11 DIAGNOSIS — I10 Essential (primary) hypertension: Secondary | ICD-10-CM | POA: Diagnosis not present

## 2019-05-11 DIAGNOSIS — Z1211 Encounter for screening for malignant neoplasm of colon: Secondary | ICD-10-CM | POA: Diagnosis not present

## 2019-05-11 DIAGNOSIS — G4733 Obstructive sleep apnea (adult) (pediatric): Secondary | ICD-10-CM | POA: Diagnosis not present

## 2019-05-16 DIAGNOSIS — Z0000 Encounter for general adult medical examination without abnormal findings: Secondary | ICD-10-CM | POA: Diagnosis not present

## 2019-05-16 DIAGNOSIS — E785 Hyperlipidemia, unspecified: Secondary | ICD-10-CM | POA: Diagnosis not present

## 2019-05-16 DIAGNOSIS — I10 Essential (primary) hypertension: Secondary | ICD-10-CM | POA: Diagnosis not present

## 2019-05-16 DIAGNOSIS — Z79899 Other long term (current) drug therapy: Secondary | ICD-10-CM | POA: Diagnosis not present

## 2019-05-16 DIAGNOSIS — Z1211 Encounter for screening for malignant neoplasm of colon: Secondary | ICD-10-CM | POA: Diagnosis not present

## 2019-05-16 DIAGNOSIS — E119 Type 2 diabetes mellitus without complications: Secondary | ICD-10-CM | POA: Diagnosis not present

## 2019-05-16 DIAGNOSIS — G4733 Obstructive sleep apnea (adult) (pediatric): Secondary | ICD-10-CM | POA: Diagnosis not present

## 2019-05-16 DIAGNOSIS — Z794 Long term (current) use of insulin: Secondary | ICD-10-CM | POA: Diagnosis not present

## 2019-07-26 DIAGNOSIS — N1832 Chronic kidney disease, stage 3b: Secondary | ICD-10-CM | POA: Diagnosis not present

## 2019-07-26 DIAGNOSIS — Z79899 Other long term (current) drug therapy: Secondary | ICD-10-CM | POA: Diagnosis not present

## 2019-07-26 DIAGNOSIS — R972 Elevated prostate specific antigen [PSA]: Secondary | ICD-10-CM | POA: Diagnosis not present

## 2019-07-26 DIAGNOSIS — I10 Essential (primary) hypertension: Secondary | ICD-10-CM | POA: Diagnosis not present

## 2019-07-26 DIAGNOSIS — E782 Mixed hyperlipidemia: Secondary | ICD-10-CM | POA: Diagnosis not present

## 2019-07-26 DIAGNOSIS — E1122 Type 2 diabetes mellitus with diabetic chronic kidney disease: Secondary | ICD-10-CM | POA: Diagnosis not present

## 2019-08-02 DIAGNOSIS — E1122 Type 2 diabetes mellitus with diabetic chronic kidney disease: Secondary | ICD-10-CM | POA: Diagnosis not present

## 2019-08-02 DIAGNOSIS — Z794 Long term (current) use of insulin: Secondary | ICD-10-CM | POA: Diagnosis not present

## 2019-08-02 DIAGNOSIS — E1159 Type 2 diabetes mellitus with other circulatory complications: Secondary | ICD-10-CM | POA: Diagnosis not present

## 2019-08-02 DIAGNOSIS — E1169 Type 2 diabetes mellitus with other specified complication: Secondary | ICD-10-CM | POA: Diagnosis not present

## 2019-08-02 DIAGNOSIS — E782 Mixed hyperlipidemia: Secondary | ICD-10-CM | POA: Diagnosis not present

## 2019-08-02 DIAGNOSIS — E669 Obesity, unspecified: Secondary | ICD-10-CM | POA: Diagnosis not present

## 2019-08-02 DIAGNOSIS — N1832 Chronic kidney disease, stage 3b: Secondary | ICD-10-CM | POA: Diagnosis not present

## 2019-09-13 DIAGNOSIS — N1832 Chronic kidney disease, stage 3b: Secondary | ICD-10-CM | POA: Diagnosis not present

## 2019-09-13 DIAGNOSIS — I129 Hypertensive chronic kidney disease with stage 1 through stage 4 chronic kidney disease, or unspecified chronic kidney disease: Secondary | ICD-10-CM | POA: Diagnosis not present

## 2019-09-13 DIAGNOSIS — Z1211 Encounter for screening for malignant neoplasm of colon: Secondary | ICD-10-CM | POA: Diagnosis not present

## 2019-09-13 DIAGNOSIS — Z79899 Other long term (current) drug therapy: Secondary | ICD-10-CM | POA: Diagnosis not present

## 2019-09-13 DIAGNOSIS — E1122 Type 2 diabetes mellitus with diabetic chronic kidney disease: Secondary | ICD-10-CM | POA: Diagnosis not present

## 2019-09-13 DIAGNOSIS — R972 Elevated prostate specific antigen [PSA]: Secondary | ICD-10-CM | POA: Diagnosis not present

## 2019-09-13 DIAGNOSIS — Z0000 Encounter for general adult medical examination without abnormal findings: Secondary | ICD-10-CM | POA: Diagnosis not present

## 2019-10-18 DIAGNOSIS — Z1211 Encounter for screening for malignant neoplasm of colon: Secondary | ICD-10-CM | POA: Diagnosis not present

## 2019-10-24 LAB — COLOGUARD: COLOGUARD: NEGATIVE

## 2019-11-10 DIAGNOSIS — Z794 Long term (current) use of insulin: Secondary | ICD-10-CM | POA: Diagnosis not present

## 2019-11-10 DIAGNOSIS — R972 Elevated prostate specific antigen [PSA]: Secondary | ICD-10-CM | POA: Diagnosis not present

## 2019-11-10 DIAGNOSIS — E782 Mixed hyperlipidemia: Secondary | ICD-10-CM | POA: Diagnosis not present

## 2019-11-10 DIAGNOSIS — E1169 Type 2 diabetes mellitus with other specified complication: Secondary | ICD-10-CM | POA: Diagnosis not present

## 2019-11-10 DIAGNOSIS — E1122 Type 2 diabetes mellitus with diabetic chronic kidney disease: Secondary | ICD-10-CM | POA: Diagnosis not present

## 2019-11-10 DIAGNOSIS — N1832 Chronic kidney disease, stage 3b: Secondary | ICD-10-CM | POA: Diagnosis not present

## 2019-11-10 DIAGNOSIS — Z79899 Other long term (current) drug therapy: Secondary | ICD-10-CM | POA: Diagnosis not present

## 2019-11-10 DIAGNOSIS — E669 Obesity, unspecified: Secondary | ICD-10-CM | POA: Diagnosis not present

## 2019-11-10 DIAGNOSIS — E1159 Type 2 diabetes mellitus with other circulatory complications: Secondary | ICD-10-CM | POA: Diagnosis not present

## 2019-11-14 NOTE — Progress Notes (Signed)
**Note Mario-Identified via Obfuscation** 11/15/2019 1:05 PM   Mario Robinson Aug 18, 1940 [id number]  Referring provider: Idelle Crouch, MD [name] [place],  [place] [address] Chief Complaint  Patient presents with  . Elevated PSA    HPI: Mario Robinson is a 79 y.o. male who presents today for re-evaluation of elevated PSA. He is accompanied by his daughter today.   Patient was last seen in clinic on 12/26/2015.  S/p negative prostate biopsy on 06/15/15 with chronic inflammation.  TRUS volume 50 g.  Most recent PSA is elevated at 9.31 as of 11/10/2019.    Today's PVR is 18 mL. He reports some urgency, frequency and nocturia x 2. He notes some leakage on occasion. He is not on any medication for this. He is not big on taking a lot of oral medication.   He is drinking plenty water and coke every once in a while. He tries to cut back on fluid intake in the evenings. His daughter reports since the passing of his wife his diet has changed and his medication has been adjusting accordingly. He has lost ~ 20 pounds.   He does snore but denies having sleep apnea or undergoing sleep apnea.   He has a history of diabetes. His insulin has changed.   He reports having a MRI in the past and received Xanax but still panicked. He is claustrophobic.   He does have a family history of prostate cancer with 2 brothers dx in there late 50s.    PSA Trend: 5/15 3.94 7/16 4.58 2/17 5.27 10/17  5.1 10/18 6.04 6/19  7.93 12/19  7.49 11/20 7.04 6/21  8.16 9/21  9.31  PMH: Past Medical History:  Diagnosis Date  . Arthritis   . Diabetes mellitus   . GERD (gastroesophageal reflux disease)   . HLD (hyperlipidemia)   . HTN (hypertension)   . Obesity   . Prostate disorder   . Sleep apnea     Surgical History: Past Surgical History:  Procedure Laterality Date  . ANKLE SURGERY    . APPENDECTOMY    . CARDIAC CATHETERIZATION    . CATARACT EXTRACTION W/PHACO Left 05/01/2016   Procedure: CATARACT  EXTRACTION PHACO AND INTRAOCULAR LENS PLACEMENT (IOC);  Surgeon: Mario Bear, MD;  Location: [facility] ORS;  Service: Ophthalmology;  Laterality: Left;  Lot # [id number] H [place]: 01:35.1 AP%: 11.2 CDE: 11.11  . CATARACT EXTRACTION W/PHACO Right 05/15/2016   Procedure: CATARACT EXTRACTION PHACO AND INTRAOCULAR LENS PLACEMENT (IOC);  Surgeon: Mario Bear, MD;  Location: [facility] ORS;  Service: Ophthalmology;  Laterality: Right;  [place] 1:06.8 AP% 11.0 CDE 7.56 Fluid pack lot # [id number]  . COLONOSCOPY    . FRACTURE SURGERY    . PROSTATE BIOPSY    . TONSILLECTOMY      Home Medications:  Allergies as of 11/15/2019   No Known Allergies     Medication List       Accurate as of November 15, 2019 11:59 PM. If you have any questions, ask your nurse or doctor.        alfuzosin 10 MG 24 hr tablet Commonly known as: UROXATRAL   amLODipine 5 MG tablet Commonly known as: NORVASC   aspirin 81 MG tablet Take 81 mg by mouth daily. Reported on 06/15/2015   diazepam 10 MG tablet Commonly known as: Valium Take 1 tablet (10 mg total) by mouth once for 1 dose. Take 1 hour prior to procedure. Please have a driver available. Started  by: Mario Espy, MD   glipiZIDE 10 MG 24 hr tablet Commonly known as: GLUCOTROL XL Take 10 mg by mouth daily.   imipramine 10 MG tablet Commonly known as: TOFRANIL Take by mouth.   lisinopril 10 MG tablet Commonly known as: ZESTRIL What changed: Another medication with the same name was removed. Continue taking this medication, and follow the directions you see here. Changed by: Mario Espy, MD   losartan-hydrochlorothiazide 100-12.5 MG tablet Commonly known as: HYZAAR   metFORMIN 1000 MG tablet Commonly known as: GLUCOPHAGE Take 1,000 mg by mouth 2 (two) times daily with a meal.   metoprolol tartrate 50 MG tablet Commonly known as: LOPRESSOR Take 50 mg by mouth 2 (two) times daily.   NovoLIN 70/30 ReliOn (70-30) 100 UNIT/ML injection Generic drug:  insulin NPH-regular Human Inject into the skin 2 (two) times daily with a meal. 30 TO 56 UNITS   omeprazole 20 MG capsule Commonly known as: PRILOSEC Take 20 mg by mouth 2 (two) times daily before a meal.   ranitidine 150 MG capsule Commonly known as: ZANTAC Take by mouth.   rosuvastatin 10 MG tablet Commonly known as: CRESTOR TAKE 1 TABLET ONE TIME DAILY   torsemide 10 MG tablet Commonly known as: DEMADEX Take by mouth.   Vitamin D3 50 MCG (2000 UT) capsule Take by mouth. Reported on 06/15/2015       Allergies: No Known Allergies  Family History: Family History  Problem Relation Age of Onset  . Heart attack Unknown   . Colon cancer Mother   . Prostate cancer Brother   . Hypertension Unknown   . Arthritis Unknown   . Bladder Cancer Neg Hx   . Kidney cancer Neg Hx     Social History:  reports that he has quit smoking. He quit after 35.00 years of use. He has never used smokeless tobacco. He reports current alcohol use. He reports that he does not use drugs.   Physical Exam: BP (!) 146/78   Pulse 73   Ht 5\' 7"  (1.702 m)   Wt 230 lb (104.3 kg)   BMI 36.02 kg/m   Constitutional:  Alert and oriented, No acute distress. HEENT: Mario Robinson AT, moist mucus membranes.  Trachea midline, no masses. Cardiovascular: No clubbing, cyanosis, or edema. Respiratory: Normal respiratory effort, no increased work of breathing. Rectal: Declined today Skin: No rashes, bruises or suspicious lesions. Neurologic: Grossly intact, no focal deficits, moving all 4 extremities. Psychiatric: Normal mood and affect. Anxious.    Pertinent image: Results for orders placed or performed in visit on 11/15/19  Microscopic Examination   Urine  Result Value Ref Range   WBC, UA 0-5 0 - 5 /hpf   RBC 0-2 0 - 2 /hpf   Epithelial Cells (non renal) None seen 0 - 10 /hpf   Bacteria, UA None seen None seen/Few  Urinalysis, Complete  Result Value Ref Range   Specific Gravity, UA >1.030 (H) 1.005 - 1.030    pH, UA 5.0 5.0 - 7.5   Color, UA Yellow Yellow   Appearance Ur Clear Clear   Leukocytes,UA Negative Negative   Protein,UA 2+ (A) Negative/Trace   Glucose, UA Trace (A) Negative   Ketones, UA Trace (A) Negative   RBC, UA Trace (A) Negative   Bilirubin, UA Negative Negative   Urobilinogen, Ur 0.2 0.2 - 1.0 mg/dL   Nitrite, UA Negative Negative   Microscopic Examination See below:   BLADDER SCAN AMB NON-IMAGING  Result Value Ref Range  Scan Result 18 ML      Assessment & Plan:    1. History of elevated/rising PSA He has a long history of fluctuating PSA which has been trending upward overall. S/p negative prostate biopsy on 06/15/15 with chronic inflammation.  TRUS volume 50 g. PSA is elevated at 9.31 as of 11/10/2019.   Deferred DRE today because patient was anxious and upset about passing of wife.  The goal is to treat clinically significant carcinoma. Given his previous negative biopsy I would like to plan for a prostate MRI w/ w/o contrast and based on results I may proceed with a fusion biopsy.  Patient is agreeable to plan and will undergo an open MRI in [place], [place] secondary to claustrophobia. Patient is requesting Valium.   -Valium Rx sent.   2. Nocturia On chronic Uroxatral. Likely multifactorial with untreated sleep apnea. Patient can not tolerate CPAP machine.  Longstanding diabetes, poorly controlled at times.  May have tried a medication in the past per Dr. Doy Hutching for BPH. Discussed behavorial modifications.  Will treat daytime urgency.  -Given 4 Myrbetriq 25 mg daily, # 28 samples; I have advised the patient of the side effects of Myrbetriq, such as: elevation in BP, urinary retention and/or HA    F/u MR, recheck of urinary symptms  [facility] [address] [place],  [address] [phone]  I, Selena Batten, am acting as a scribe for Dr. Hollice Robinson.  I have reviewed the above documentation for  accuracy and completeness, and I agree with the above.   Mario Espy, MD  I spent 45 total minutes on the day of the encounter including pre-visit review of the medical record, face-to-face time with the patient, and post visit ordering of labs/imaging/tests.

## 2019-11-15 ENCOUNTER — Other Ambulatory Visit: Payer: Self-pay

## 2019-11-15 ENCOUNTER — Ambulatory Visit: Payer: Medicare HMO | Admitting: Urology

## 2019-11-15 VITALS — BP 146/78 | HR 73 | Ht 67.0 in | Wt 230.0 lb

## 2019-11-15 DIAGNOSIS — R351 Nocturia: Secondary | ICD-10-CM | POA: Diagnosis not present

## 2019-11-15 DIAGNOSIS — R972 Elevated prostate specific antigen [PSA]: Secondary | ICD-10-CM

## 2019-11-15 LAB — BLADDER SCAN AMB NON-IMAGING: Scan Result: 18

## 2019-11-15 MED ORDER — DIAZEPAM 10 MG PO TABS: 10.0000 mg | ORAL_TABLET | Freq: Once | ORAL | 0 refills | Status: AC

## 2019-11-15 NOTE — Patient Instructions (Signed)
Prostate MRI Prep: ? ?1- No ejaculation 48 hours prior to exam ? ?2- No food or drink or caffeine 4 hours prior to exam ? ?3- Fleets enema needs to be done 4 hours prior to exam  ? ?4- Urinate just prior to exam  ?

## 2019-11-16 LAB — URINALYSIS, COMPLETE
Bilirubin, UA: NEGATIVE
Leukocytes,UA: NEGATIVE
Nitrite, UA: NEGATIVE
Specific Gravity, UA: >1.03 — ABNORMAL HIGH (ref 1.005–1.030)
Urobilinogen, Ur: 0.2 mg/dL (ref 0.2–1.0)
pH, UA: 5 (ref 5.0–7.5)

## 2019-11-16 LAB — MICROSCOPIC EXAMINATION
Bacteria, UA: NONE SEEN
Epithelial Cells (non renal): NONE SEEN /hpf (ref 0–10)

## 2019-11-28 ENCOUNTER — Telehealth: Payer: Self-pay | Admitting: Urology

## 2019-11-28 NOTE — Telephone Encounter (Signed)
Created in error

## 2019-12-20 ENCOUNTER — Ambulatory Visit: Payer: Self-pay | Admitting: Urology

## 2019-12-21 DIAGNOSIS — E119 Type 2 diabetes mellitus without complications: Secondary | ICD-10-CM | POA: Diagnosis not present

## 2019-12-23 ENCOUNTER — Ambulatory Visit (HOSPITAL_COMMUNITY)
Admission: RE | Admit: 2019-12-23 | Discharge: 2019-12-23 | Disposition: A | Discharge status: Home / Self Care | Payer: Medicare HMO | Source: Ambulatory Visit | Attending: Urology | Admitting: Urology

## 2019-12-23 DIAGNOSIS — R972 Elevated prostate specific antigen [PSA]: Secondary | ICD-10-CM | POA: Diagnosis not present

## 2019-12-23 DIAGNOSIS — N400 Enlarged prostate without lower urinary tract symptoms: Secondary | ICD-10-CM | POA: Diagnosis not present

## 2019-12-23 DIAGNOSIS — R590 Localized enlarged lymph nodes: Secondary | ICD-10-CM | POA: Diagnosis not present

## 2019-12-23 MED ORDER — GADOBUTROL 1 MMOL/ML IV SOLN
10.0000 mL | Freq: Once | INTRAVENOUS | Status: AC | PRN
  Administered 2019-12-23: 10 mL via INTRAVENOUS

## 2020-01-03 ENCOUNTER — Other Ambulatory Visit: Payer: Self-pay

## 2020-01-03 ENCOUNTER — Ambulatory Visit: Payer: Medicare HMO | Admitting: Urology

## 2020-01-03 ENCOUNTER — Encounter: Payer: Self-pay | Admitting: Urology

## 2020-01-03 VITALS — BP 141/70 | HR 72 | Wt 224.0 lb

## 2020-01-03 DIAGNOSIS — R972 Elevated prostate specific antigen [PSA]: Secondary | ICD-10-CM | POA: Diagnosis not present

## 2020-01-03 NOTE — Progress Notes (Signed)
01/03/2020 10:51 AM   De Burrs [birth year] [id number]  Referring provider: Idelle Crouch, MD [facility] [place],  [place] [address]  Chief Complaint  Patient presents with  . Follow-up    HPI: 79 year old male with a personal history of rising PSA over the past 6 years who returns to the office status post prostate MRI accompanied by his daughter.  S/p negative prostate biopsy on 06/15/15 with chronic inflammation. TRUS volume 50 g.  Most recent PSA is elevated at 9.31 as of 11/10/2019.  Declined rectal exam.  Prostate MRI does show a significant PI-RADS 5 lesion measuring 3.8 cm in the right anterior prostate, mid gland to base which transverses the anterior fibromuscular stroma.  No adenopathy, SV involvement or metastatic disease.  Prostate volume calculated at 66 mL.  Urinary symptoms unchanged from last visit.   PMH: Past Medical History:  Diagnosis Date  . Arthritis   . Diabetes mellitus   . GERD (gastroesophageal reflux disease)   . HLD (hyperlipidemia)   . HTN (hypertension)   . Obesity   . Prostate disorder   . Sleep apnea     Surgical History: Past Surgical History:  Procedure Laterality Date  . ANKLE SURGERY    . APPENDECTOMY    . CARDIAC CATHETERIZATION    . CATARACT EXTRACTION W/PHACO Left 05/01/2016   Procedure: CATARACT EXTRACTION PHACO AND INTRAOCULAR LENS PLACEMENT (IOC);  Surgeon: Eulogio Bear, MD;  Location: [facility] ORS;  Service: Ophthalmology;  Laterality: Left;  Lot # [id number] H [place]: 01:35.1 AP%: 11.2 CDE: 11.11  . CATARACT EXTRACTION W/PHACO Right 05/15/2016   Procedure: CATARACT EXTRACTION PHACO AND INTRAOCULAR LENS PLACEMENT (IOC);  Surgeon: Eulogio Bear, MD;  Location: [facility] ORS;  Service: Ophthalmology;  Laterality: Right;  [place] 1:06.8 AP% 11.0 CDE 7.56 Fluid pack lot # [id number]  . COLONOSCOPY    . FRACTURE SURGERY    . PROSTATE BIOPSY    . TONSILLECTOMY      Home Medications:  Allergies as  of 01/03/2020   No Known Allergies     Medication List       Accurate as of January 03, 2020 10:51 AM. If you have any questions, ask your nurse or doctor.        alfuzosin 10 MG 24 hr tablet Commonly known as: UROXATRAL   amLODipine 5 MG tablet Commonly known as: NORVASC   aspirin 81 MG tablet Take 81 mg by mouth daily. Reported on 06/15/2015   glipiZIDE 10 MG 24 hr tablet Commonly known as: GLUCOTROL XL Take 10 mg by mouth daily.   imipramine 10 MG tablet Commonly known as: TOFRANIL Take by mouth.   lisinopril 10 MG tablet Commonly known as: ZESTRIL   losartan-hydrochlorothiazide 100-12.5 MG tablet Commonly known as: HYZAAR   metFORMIN 1000 MG tablet Commonly known as: GLUCOPHAGE Take 1,000 mg by mouth 2 (two) times daily with a meal.   metoprolol tartrate 50 MG tablet Commonly known as: LOPRESSOR Take 50 mg by mouth 2 (two) times daily.   NovoLIN 70/30 ReliOn (70-30) 100 UNIT/ML injection Generic drug: insulin NPH-regular Human Inject into the skin 2 (two) times daily with a meal. 30 TO 56 UNITS   omeprazole 20 MG capsule Commonly known as: PRILOSEC Take 20 mg by mouth 2 (two) times daily before a meal.   ranitidine 150 MG capsule Commonly known as: ZANTAC Take by mouth.   rosuvastatin 10 MG tablet Commonly known as: CRESTOR TAKE 1 TABLET ONE TIME  DAILY   torsemide 10 MG tablet Commonly known as: DEMADEX Take by mouth.   Vitamin D3 50 MCG (2000 UT) capsule Take by mouth. Reported on 06/15/2015       Allergies: No Known Allergies  Family History: Family History  Problem Relation Age of Onset  . Heart attack Unknown   . Colon cancer Mother   . Prostate cancer Brother   . Hypertension Unknown   . Arthritis Unknown   . Bladder Cancer Neg Hx   . Kidney cancer Neg Hx     Social History:  reports that he has quit smoking. He quit after 35.00 years of use. He has never used smokeless tobacco. He reports current alcohol use. He reports that he  does not use drugs.   Physical Exam: BP (!) 141/70   Pulse 72   Wt 224 lb (101.6 kg)   BMI 35.08 kg/m   Constitutional:  Alert and oriented, No acute distress.  Obese.  Accompanied by daughter. HEENT: [place] AT, moist mucus membranes.  Trachea midline, no masses. Cardiovascular: No clubbing, cyanosis, or edema. Respiratory: Normal respiratory effort, no increased work of breathing. Skin: No rashes, bruises or suspicious lesions. Neurologic: Grossly intact, no focal deficits, moving all 4 extremities. Psychiatric: Normal mood and affect.   Pertinent Imaging: Prostate MRI from 12/26/2019 was personally reviewed today.  Agree with radiologic interpretation.  IMPRESSION: 3.8 cm lesion at the right anterior prostate, mid gland to base, as described above. This is highly suspicious for high-grade macroscopic prostate cancer. PI-RADS 5. This lesion was marked in [place] for potential UroNAV biopsy.  Lesion transgresses the anterior fibromuscular stroma. No seminal vesicle invasion, lymphadenopathy, or metastatic disease.  Calculated prostate volume 66.3 mL.  Assessment & Plan:    1. Elevated PSA MRI was reviewed with the patient and his daughter  In the setting of his age and comorbidities, generally we would not necessarily recommend pursuing this however the purpose of the MRI was to assess for any concerning or high-grade lesions.  He does not fact have a quite large high-grade lesion which appears to be violating the prostatic capsule anteriorly.  This is concerning for high-grade lesion and if in fact it does represent high risk prostate cancer, consideration of treatment would be warranted.  We discussed the risk of benefits of biopsy.  Recommend a fusion biopsy to ensure that this area specifically is a biopsy.  Alternatives including cognitive fusion biopsy were discussed, given the relatively large size of the lesion, this was likely be feasible.  The most  interested in pursuing fusion biopsy.  The plan to return after the holidays after the biopsy has been performed to discuss the pathology results and/or treatment options.  All questions answered today. - Ambulatory referral to Urology   Hollice Espy, MD  [place] [address] [place], [place] [address] [phone]

## 2020-01-23 DIAGNOSIS — C61 Malignant neoplasm of prostate: Secondary | ICD-10-CM | POA: Diagnosis not present

## 2020-01-23 DIAGNOSIS — R972 Elevated prostate specific antigen [PSA]: Secondary | ICD-10-CM | POA: Diagnosis not present

## 2020-01-31 ENCOUNTER — Other Ambulatory Visit: Payer: Self-pay | Admitting: Urology

## 2020-02-01 ENCOUNTER — Encounter: Payer: Self-pay | Admitting: Urology

## 2020-02-01 ENCOUNTER — Ambulatory Visit (INDEPENDENT_AMBULATORY_CARE_PROVIDER_SITE_OTHER): Payer: Medicare HMO | Admitting: Urology

## 2020-02-01 ENCOUNTER — Other Ambulatory Visit: Payer: Self-pay

## 2020-02-01 VITALS — BP 138/78 | HR 80 | Ht 67.0 in | Wt 217.0 lb

## 2020-02-01 DIAGNOSIS — C61 Malignant neoplasm of prostate: Secondary | ICD-10-CM | POA: Diagnosis not present

## 2020-02-01 DIAGNOSIS — R351 Nocturia: Secondary | ICD-10-CM

## 2020-02-01 DIAGNOSIS — N453 Epididymo-orchitis: Secondary | ICD-10-CM | POA: Diagnosis not present

## 2020-02-01 MED ORDER — SULFAMETHOXAZOLE-TRIMETHOPRIM 800-160 MG PO TABS: 1.0000 | ORAL_TABLET | Freq: Two times a day (BID) | ORAL | 0 refills | Status: AC

## 2020-02-01 NOTE — Progress Notes (Signed)
02/01/2020 5:01 PM   De Burrs 1941-01-20 [id number]  Referring provider: Idelle Crouch, MD [facility] [place],  [place] [address]  Chief Complaint  Patient presents with  . Results    HPI: 79 year old male with newly diagnosed prostate cancer who presents today to discuss his biopsy results.  He initially presented with an elevated/rising PSA.  She underwent a negative prostate biopsy in 2017 with chronic inflammation.  Ultimately, he underwent MRI (PI-RADS 5 lesion measuring 3.8 cm along the right anterior prostate) of followed by an MRI fusion biopsy for which she returns with the results today.  Prostate volume approximately 70 g.  Area of interest in MRI revealed benign prostatic tissue.  Hit incidental focus of 1 core Gleason 3+3 at the right apex, 5% of the tissue.  He is frustrated today about some confusion with periprocedural antibiotics.  Ultimately this was worked out.  He reports that 2 days ago, he developed some swelling and tenderness in his right testicle.  He thought he slept on it wrong.  The swelling and tenderness is improved but not resolved.  He denies any associated urinary symptoms, no fevers or chills.  He tried Myrbetriq samples for nocturia.  He does not feel like these helped him much.  He reports that he is restless anyway secondary to anxiety and bereavement.  He thinks this is part actually why he is getting up at night to void.  He is not interested in taking these medications further.  PSA Trend: 5/15 3.94 7/16 4.58 2/17 5.27 10/17 5.1 10/18 6.04 6/19  7.93 12/19  7.49 11/20 7.04 6/21  8.16 9/21  9.31   PMH: Past Medical History:  Diagnosis Date  . Arthritis   . Diabetes mellitus   . GERD (gastroesophageal reflux disease)   . HLD (hyperlipidemia)   . HTN (hypertension)   . Obesity   . Prostate disorder   . Sleep apnea     Surgical History: Past Surgical History:  Procedure Laterality  Date  . ANKLE SURGERY    . APPENDECTOMY    . CARDIAC CATHETERIZATION    . CATARACT EXTRACTION W/PHACO Left 05/01/2016   Procedure: CATARACT EXTRACTION PHACO AND INTRAOCULAR LENS PLACEMENT (IOC);  Surgeon: Eulogio Bear, MD;  Location: [facility] ORS;  Service: Ophthalmology;  Laterality: Left;  Lot # [id number] H [place]: 01:35.1 AP%: 11.2 CDE: 11.11  . CATARACT EXTRACTION W/PHACO Right 05/15/2016   Procedure: CATARACT EXTRACTION PHACO AND INTRAOCULAR LENS PLACEMENT (IOC);  Surgeon: Eulogio Bear, MD;  Location: [facility] ORS;  Service: Ophthalmology;  Laterality: Right;  [place] 1:06.8 AP% 11.0 CDE 7.56 Fluid pack lot # [id number]  . COLONOSCOPY    . FRACTURE SURGERY    . PROSTATE BIOPSY    . TONSILLECTOMY      Home Medications:  Allergies as of 02/01/2020   No Known Allergies     Medication List       Accurate as of February 01, 2020  5:01 PM. If you have any questions, ask your nurse or doctor.        STOP taking these medications   alfuzosin 10 MG 24 hr tablet Commonly known as: UROXATRAL Stopped by: Hollice Espy, MD     TAKE these medications   amLODipine 5 MG tablet Commonly known as: NORVASC   aspirin 81 MG tablet Take 81 mg by mouth daily. Reported on 06/15/2015   diazepam 10 MG tablet Commonly known as: VALIUM   famotidine 20 MG  tablet Commonly known as: PEPCID Take by mouth.   glipiZIDE 10 MG 24 hr tablet Commonly known as: GLUCOTROL XL Take 10 mg by mouth daily.   imipramine 10 MG tablet Commonly known as: TOFRANIL Take by mouth.   lisinopril 10 MG tablet Commonly known as: ZESTRIL   losartan-hydrochlorothiazide 100-12.5 MG tablet Commonly known as: HYZAAR   metFORMIN 1000 MG tablet Commonly known as: GLUCOPHAGE Take 1,000 mg by mouth 2 (two) times daily with a meal.   metoprolol tartrate 50 MG tablet Commonly known as: LOPRESSOR Take 50 mg by mouth 2 (two) times daily.   NovoLIN 70/30 ReliOn (70-30) 100 UNIT/ML injection Generic drug: insulin  NPH-regular Human Inject into the skin 2 (two) times daily with a meal. 30 TO 56 UNITS   omeprazole 20 MG capsule Commonly known as: PRILOSEC Take 20 mg by mouth 2 (two) times daily before a meal.   ranitidine 150 MG capsule Commonly known as: ZANTAC Take by mouth.   rosuvastatin 10 MG tablet Commonly known as: CRESTOR TAKE 1 TABLET ONE TIME DAILY   sulfamethoxazole-trimethoprim 800-160 MG tablet Commonly known as: BACTRIM DS Take 1 tablet by mouth every 12 (twelve) hours for 10 days. Started by: Hollice Espy, MD   torsemide 10 MG tablet Commonly known as: DEMADEX Take by mouth.   Vitamin D3 50 MCG (2000 UT) capsule Take by mouth. Reported on 06/15/2015       Allergies: No Known Allergies  Family History: Family History  Problem Relation Age of Onset  . Heart attack Unknown   . Colon cancer Mother   . Prostate cancer Brother   . Hypertension Unknown   . Arthritis Unknown   . Bladder Cancer Neg Hx   . Kidney cancer Neg Hx     Social History:  reports that he has quit smoking. He quit after 35.00 years of use. He has never used smokeless tobacco. He reports current alcohol use. He reports that he does not use drugs.   Physical Exam: BP 138/78   Pulse 80   Ht 5\' 7"  (1.702 m)   Wt 217 lb (98.4 kg)   BMI 33.99 kg/m   Constitutional:  Alert and oriented, No acute distress. HEENT: [place] AT, moist mucus membranes.  Trachea midline, no masses. Cardiovascular: No clubbing, cyanosis, or edema. Respiratory: Normal respiratory effort, no increased work of breathing. GU: Left testicle normal.  Right testicle mildly enlarged, firm, with significant tenderness of the right epididymis.  Normal phallus. Skin: No rashes, bruises or suspicious lesions. Neurologic: Grossly intact, no focal deficits, moving all 4 extremities. Psychiatric: Normal mood and affect.  Urinalysis UA today negative  Assessment & Plan:    1. 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  He falls into the very low risk category with a very minute fragment of disease.  As such, NCCN AUA guidelines would most currently suggest active surveillance.  Answered lots of questions today particular from his daughter about the risk and benefits of proceeding with intervention, discussed that removing his prostate at his age with his comorbidities would likely lead to incontinence, erectile dysfunction and severely impacts the quality of his life.  Ultimately, she understood the rationale of active surveillance.  They were provided with the pathology results and he may be bringing by cancer policy for [place] to fill out.  Plan have him return in 6 months with a PSA.  Will strongly consider repeat  MRI if his PSA continues to trend upwards but try to avoid prostate biopsy of all possible. - PSA; Future - Urinalysis, Complete - CULTURE, URINE COMPREHENSIVE  2. Epididymoorchitis Right testicular and epididymal tenderness, may be inflammatory as it is improving without antibiotics but in light of recent prostate biopsy, will treat presumptively with antibiotics.  Plan for Bactrim DS twice daily for 10 days.  Urine culture pending.  Advised to return sooner if he fails to improve or worsens.  3. Nocturia Failed Myrbetriq, not interested in further intervention at this time.  Discussed behavioral modification again today.  F/u 6 months with PSA prior   Hollice Espy, MD  [place] [address] [place], [place] [address] [phone]  I spent 40 total minutes on the day of the encounter including pre-visit review of the medical record, face-to-face time with the patient, and post visit ordering of labs/imaging/tests.

## 2020-02-03 LAB — MICROSCOPIC EXAMINATION: Bacteria, UA: NONE SEEN

## 2020-02-03 LAB — URINALYSIS, COMPLETE
Bilirubin, UA: NEGATIVE
Ketones, UA: NEGATIVE
Leukocytes,UA: NEGATIVE
Nitrite, UA: NEGATIVE
Specific Gravity, UA: >1.03 — ABNORMAL HIGH (ref 1.005–1.030)
Urobilinogen, Ur: 0.2 mg/dL (ref 0.2–1.0)
pH, UA: 5.5 (ref 5.0–7.5)

## 2020-02-04 LAB — CULTURE, URINE COMPREHENSIVE

## 2020-02-29 DIAGNOSIS — E1169 Type 2 diabetes mellitus with other specified complication: Secondary | ICD-10-CM | POA: Diagnosis not present

## 2020-02-29 DIAGNOSIS — E669 Obesity, unspecified: Secondary | ICD-10-CM | POA: Diagnosis not present

## 2020-02-29 DIAGNOSIS — E1142 Type 2 diabetes mellitus with diabetic polyneuropathy: Secondary | ICD-10-CM | POA: Diagnosis not present

## 2020-02-29 DIAGNOSIS — Z794 Long term (current) use of insulin: Secondary | ICD-10-CM | POA: Diagnosis not present

## 2020-02-29 DIAGNOSIS — E782 Mixed hyperlipidemia: Secondary | ICD-10-CM | POA: Diagnosis not present

## 2020-02-29 DIAGNOSIS — E1159 Type 2 diabetes mellitus with other circulatory complications: Secondary | ICD-10-CM | POA: Diagnosis not present

## 2020-02-29 DIAGNOSIS — N1832 Chronic kidney disease, stage 3b: Secondary | ICD-10-CM | POA: Diagnosis not present

## 2020-02-29 DIAGNOSIS — E1122 Type 2 diabetes mellitus with diabetic chronic kidney disease: Secondary | ICD-10-CM | POA: Diagnosis not present

## 2020-03-19 DIAGNOSIS — I10 Essential (primary) hypertension: Secondary | ICD-10-CM | POA: Diagnosis not present

## 2020-03-19 DIAGNOSIS — Z794 Long term (current) use of insulin: Secondary | ICD-10-CM | POA: Diagnosis not present

## 2020-03-19 DIAGNOSIS — N1832 Chronic kidney disease, stage 3b: Secondary | ICD-10-CM | POA: Diagnosis not present

## 2020-03-19 DIAGNOSIS — F419 Anxiety disorder, unspecified: Secondary | ICD-10-CM | POA: Diagnosis not present

## 2020-03-19 DIAGNOSIS — Z79899 Other long term (current) drug therapy: Secondary | ICD-10-CM | POA: Diagnosis not present

## 2020-03-19 DIAGNOSIS — F32A Depression, unspecified: Secondary | ICD-10-CM | POA: Diagnosis not present

## 2020-03-19 DIAGNOSIS — E1122 Type 2 diabetes mellitus with diabetic chronic kidney disease: Secondary | ICD-10-CM | POA: Diagnosis not present

## 2020-03-19 DIAGNOSIS — E782 Mixed hyperlipidemia: Secondary | ICD-10-CM | POA: Diagnosis not present

## 2020-04-30 DIAGNOSIS — E785 Hyperlipidemia, unspecified: Secondary | ICD-10-CM | POA: Diagnosis not present

## 2020-04-30 DIAGNOSIS — Z0000 Encounter for general adult medical examination without abnormal findings: Secondary | ICD-10-CM | POA: Diagnosis not present

## 2020-04-30 DIAGNOSIS — F419 Anxiety disorder, unspecified: Secondary | ICD-10-CM | POA: Diagnosis not present

## 2020-04-30 DIAGNOSIS — Z79899 Other long term (current) drug therapy: Secondary | ICD-10-CM | POA: Diagnosis not present

## 2020-04-30 DIAGNOSIS — E119 Type 2 diabetes mellitus without complications: Secondary | ICD-10-CM | POA: Diagnosis not present

## 2020-04-30 DIAGNOSIS — F32A Depression, unspecified: Secondary | ICD-10-CM | POA: Diagnosis not present

## 2020-04-30 DIAGNOSIS — G4733 Obstructive sleep apnea (adult) (pediatric): Secondary | ICD-10-CM | POA: Diagnosis not present

## 2020-04-30 DIAGNOSIS — Z794 Long term (current) use of insulin: Secondary | ICD-10-CM | POA: Diagnosis not present

## 2020-04-30 DIAGNOSIS — I10 Essential (primary) hypertension: Secondary | ICD-10-CM | POA: Diagnosis not present

## 2020-07-27 ENCOUNTER — Other Ambulatory Visit: Payer: Self-pay

## 2020-07-27 DIAGNOSIS — C61 Malignant neoplasm of prostate: Secondary | ICD-10-CM

## 2020-07-31 ENCOUNTER — Other Ambulatory Visit: Payer: Self-pay

## 2020-07-31 ENCOUNTER — Other Ambulatory Visit: Payer: Medicare HMO

## 2020-07-31 DIAGNOSIS — C61 Malignant neoplasm of prostate: Secondary | ICD-10-CM

## 2020-08-01 ENCOUNTER — Ambulatory Visit: Payer: Self-pay | Admitting: Urology

## 2020-08-01 LAB — PSA: Prostate Specific Ag, Serum: 6.4 ng/mL — ABNORMAL HIGH (ref 0.0–4.0)

## 2020-08-08 ENCOUNTER — Other Ambulatory Visit: Payer: Self-pay

## 2020-08-08 ENCOUNTER — Encounter: Payer: Self-pay | Admitting: Urology

## 2020-08-08 ENCOUNTER — Ambulatory Visit: Payer: Medicare HMO | Admitting: Urology

## 2020-08-08 VITALS — BP 159/77 | HR 61 | Ht 66.0 in | Wt 207.4 lb

## 2020-08-08 DIAGNOSIS — C61 Malignant neoplasm of prostate: Secondary | ICD-10-CM

## 2020-08-08 DIAGNOSIS — R351 Nocturia: Secondary | ICD-10-CM

## 2020-08-08 MED ORDER — FINASTERIDE 5 MG PO TABS: 5.0000 mg | ORAL_TABLET | Freq: Every day | ORAL | 2 refills | Status: DC

## 2020-08-08 NOTE — Patient Instructions (Addendum)
Pick up your new prescription.  We will see you back in 6 months.     PSA Trend: 5/15 3.94 7/16 4.58 2/17 5.27 10/17  5.1 10/18 6.04 6/19  7.93 12/19  7.49 11/20 7.04 6/21  8.16 9/21  9.31 6/22 6.4

## 2020-08-10 NOTE — Progress Notes (Signed)
08/08/2020 11:46 AM   De Burrs 1940/09/18 [id number]  Referring provider: Idelle Crouch, MD [place] [facility] [place],  [place] [address]  Chief Complaint  Patient presents with   Elevated PSA    HPI: 80 year old male with low risk prostate cancer who returns today for 44-month follow-up.  He initially presented with an elevated/rising PSA.  He underwent a negative prostate biopsy in 2017 with chronic inflammation.    Ultimately, he underwent MRI (PI-RADS 5 lesion measuring 3.8 cm along the right anterior prostate) of followed by an MRI fusion biopsy for which she returns with the results today.  Prostate volume approximately 70 g.   Area of interest in MRI revealed benign prostatic tissue.  He did have incidental focus of 1 core Gleason 3+3 at the right apex, 5% of the tissue.  Urinary symptoms are stable.  She tried Myrbetriq for his urinary urgency frequency symptoms but did not see much benefit.  He is not interested in any further treatments.  He continues to miss his wife seems to be doing better today.   PSA Trend: 5/15 3.94 7/16 4.58 2/17 5.27 10/17  5.1 10/18 6.04 6/19  7.93 12/19  7.49 11/20 7.04 6/21  8.16 9/21  9.31  622  6.4    PMH: Past Medical History:  Diagnosis Date   Arthritis    Diabetes mellitus    GERD (gastroesophageal reflux disease)    HLD (hyperlipidemia)    HTN (hypertension)    Obesity    Prostate disorder    Sleep apnea     Surgical History: Past Surgical History:  Procedure Laterality Date   ANKLE SURGERY     APPENDECTOMY     CARDIAC CATHETERIZATION     CATARACT EXTRACTION W/PHACO Left 05/01/2016   Procedure: CATARACT EXTRACTION PHACO AND INTRAOCULAR LENS PLACEMENT ([place]);  Surgeon: Eulogio Bear, MD;  Location: [facility] ORS;  Service: Ophthalmology;  Laterality: Left;  Lot # [id number] H [place]: 01:35.1 AP%: 11.2 CDE: 11.11   CATARACT EXTRACTION W/PHACO Right 05/15/2016   Procedure: CATARACT  EXTRACTION PHACO AND INTRAOCULAR LENS PLACEMENT ([facility]);  Surgeon: Eulogio Bear, MD;  Location: [facility] ORS;  Service: Ophthalmology;  Laterality: Right;  [place] 1:06.8 AP% 11.0 CDE 7.56 Fluid pack lot # [id number]   COLONOSCOPY     FRACTURE SURGERY     PROSTATE BIOPSY     TONSILLECTOMY      Home Medications:  Allergies as of 08/08/2020   No Known Allergies      Medication List        Accurate as of August 08, 2020 11:59 PM. If you have any questions, ask your nurse or doctor.          STOP taking these medications    diazepam 10 MG tablet Commonly known as: VALIUM Stopped by: Hollice Espy, MD   imipramine 10 MG tablet Commonly known as: TOFRANIL Stopped by: Hollice Espy, MD   losartan-hydrochlorothiazide 100-12.5 MG tablet Commonly known as: HYZAAR Stopped by: Hollice Espy, MD   metFORMIN 1000 MG tablet Commonly known as: GLUCOPHAGE Stopped by: Hollice Espy, MD   omeprazole 20 MG capsule Commonly known as: PRILOSEC Stopped by: Hollice Espy, MD   ranitidine 150 MG capsule Commonly known as: ZANTAC Stopped by: Hollice Espy, MD   torsemide 10 MG tablet Commonly known as: DEMADEX Stopped by: Hollice Espy, MD   Vitamin D3 50 MCG (2000 UT) capsule Stopped by: Hollice Espy, MD  TAKE these medications    alfuzosin 10 MG 24 hr tablet Commonly known as: UROXATRAL   amLODipine 5 MG tablet Commonly known as: NORVASC What changed: Another medication with the same name was removed. Continue taking this medication, and follow the directions you see here. Changed by: Hollice Espy, MD   aspirin 81 MG tablet Take 81 mg by mouth daily. Reported on 06/15/2015   famotidine 20 MG tablet Commonly known as: PEPCID Take by mouth.   finasteride 5 MG tablet Commonly known as: PROSCAR Take 1 tablet (5 mg total) by mouth daily. Started by: Hollice Espy, MD   glipiZIDE 10 MG 24 hr tablet Commonly known as: GLUCOTROL XL Take 10 mg by mouth  daily.   Lantus SoloStar 100 UNIT/ML Solostar Pen Generic drug: insulin glargine   lisinopril 10 MG tablet Commonly known as: ZESTRIL Take 1 tablet by mouth daily. What changed: Another medication with the same name was removed. Continue taking this medication, and follow the directions you see here. Changed by: Hollice Espy, MD   metoprolol tartrate 50 MG tablet Commonly known as: LOPRESSOR Take 50 mg by mouth 2 (two) times daily.   NovoLIN 70/30 ReliOn (70-30) 100 UNIT/ML injection Generic drug: insulin NPH-regular Human Inject into the skin 2 (two) times daily with a meal. 30 TO 56 UNITS   PARoxetine 20 MG tablet Commonly known as: PAXIL   rosuvastatin 10 MG tablet Commonly known as: CRESTOR TAKE 1 TABLET ONE TIME DAILY        Allergies: No Known Allergies  Family History: Family History  Problem Relation Age of Onset   Heart attack Unknown    Colon cancer Mother    Prostate cancer Brother    Hypertension Unknown    Arthritis Unknown    Bladder Cancer Neg Hx    Kidney cancer Neg Hx     Social History:  reports that he has quit smoking. His smoking use included cigarettes. He has never used smokeless tobacco. He reports current alcohol use. He reports that he does not use drugs.   Physical Exam: BP (!) 159/77   Pulse 61   Ht 5\' 6"  (1.676 m)   Wt 207 lb 6.4 oz (94.1 kg)   BMI 33.48 kg/m   Constitutional:  Alert and oriented, No acute distress. HEENT: [place] AT, moist mucus membranes.  Trachea midline, no masses. Cardiovascular: No clubbing, cyanosis, or edema. Respiratory: Normal respiratory effort, no increased work of breathing. Skin: No rashes, bruises or suspicious lesions. Neurologic: Grossly intact, no focal deficits, moving all 4 extremities. Psychiatric: Normal mood and affect.    Assessment & Plan:    1. Prostate cancer ([place]) Low risk prostate active surveillance  PSA remains within normal range, will manage conservatively  Plan for  PSA/DRE in 6 months  2. Nocturia Improved with behavioral modification, will continue to monitor   Follow-up in 6 months with PSA/DRE  Hollice Espy, MD  [place] [address] [place], [place] [address] [phone]

## 2020-08-22 DIAGNOSIS — E1159 Type 2 diabetes mellitus with other circulatory complications: Secondary | ICD-10-CM | POA: Diagnosis not present

## 2020-08-22 DIAGNOSIS — Z79899 Other long term (current) drug therapy: Secondary | ICD-10-CM | POA: Diagnosis not present

## 2020-08-29 DIAGNOSIS — N1832 Chronic kidney disease, stage 3b: Secondary | ICD-10-CM | POA: Diagnosis not present

## 2020-08-29 DIAGNOSIS — E1142 Type 2 diabetes mellitus with diabetic polyneuropathy: Secondary | ICD-10-CM | POA: Diagnosis not present

## 2020-08-29 DIAGNOSIS — N183 Chronic kidney disease, stage 3 unspecified: Secondary | ICD-10-CM | POA: Diagnosis not present

## 2020-08-29 DIAGNOSIS — Z79899 Other long term (current) drug therapy: Secondary | ICD-10-CM | POA: Diagnosis not present

## 2020-08-29 DIAGNOSIS — E1122 Type 2 diabetes mellitus with diabetic chronic kidney disease: Secondary | ICD-10-CM | POA: Diagnosis not present

## 2020-08-29 DIAGNOSIS — E1159 Type 2 diabetes mellitus with other circulatory complications: Secondary | ICD-10-CM | POA: Diagnosis not present

## 2020-08-29 DIAGNOSIS — E669 Obesity, unspecified: Secondary | ICD-10-CM | POA: Diagnosis not present

## 2020-08-29 DIAGNOSIS — E782 Mixed hyperlipidemia: Secondary | ICD-10-CM | POA: Diagnosis not present

## 2020-08-29 DIAGNOSIS — I129 Hypertensive chronic kidney disease with stage 1 through stage 4 chronic kidney disease, or unspecified chronic kidney disease: Secondary | ICD-10-CM | POA: Diagnosis not present

## 2020-08-29 DIAGNOSIS — E1169 Type 2 diabetes mellitus with other specified complication: Secondary | ICD-10-CM | POA: Diagnosis not present

## 2020-08-29 DIAGNOSIS — Z794 Long term (current) use of insulin: Secondary | ICD-10-CM | POA: Diagnosis not present

## 2020-12-14 DIAGNOSIS — Z23 Encounter for immunization: Secondary | ICD-10-CM | POA: Diagnosis not present

## 2020-12-24 DIAGNOSIS — R6889 Other general symptoms and signs: Secondary | ICD-10-CM | POA: Diagnosis not present

## 2020-12-24 DIAGNOSIS — Z03818 Encounter for observation for suspected exposure to other biological agents ruled out: Secondary | ICD-10-CM | POA: Diagnosis not present

## 2021-01-31 DIAGNOSIS — E119 Type 2 diabetes mellitus without complications: Secondary | ICD-10-CM | POA: Diagnosis not present

## 2021-02-01 ENCOUNTER — Other Ambulatory Visit: Payer: Self-pay

## 2021-02-01 DIAGNOSIS — C61 Malignant neoplasm of prostate: Secondary | ICD-10-CM

## 2021-02-04 ENCOUNTER — Other Ambulatory Visit: Payer: Self-pay

## 2021-02-04 ENCOUNTER — Other Ambulatory Visit: Payer: Medicare HMO

## 2021-02-04 DIAGNOSIS — C61 Malignant neoplasm of prostate: Secondary | ICD-10-CM | POA: Diagnosis not present

## 2021-02-05 LAB — PSA: Prostate Specific Ag, Serum: 1.9 ng/mL (ref 0.0–4.0)

## 2021-02-06 NOTE — Progress Notes (Signed)
02/07/21 11:24 AM   De Burrs 12-Apr-1940 [id number]  Referring provider:  Idelle Crouch, MD [place] [facility] [place],  [place] [address] Chief Complaint  Patient presents with   Prostate Cancer     HPI: Mario Robinson is a 80 y.o.male with a personal history of prostate cancer, who presents today for 6 month follow-up.   He initially presented with an elevated/rising PSA.  He underwent a negative prostate biopsy in 2017 with chronic inflammation.     Ultimately, he underwent MRI (PI-RADS 5 lesion measuring 3.8 cm along the right anterior prostate) of followed by an MRI fusion biopsy for which she returns with the results today.  Prostate volume approximately 70 g.   Area of interest in MRI revealed benign prostatic tissue.  He did have incidental focus of 1 core Gleason 3+3 at the right apex, 5% of the tissue.  His most recent PSA on 02/04/2021 was 1.9. He was started on finasteride 6 months ago.    He is doing well today. He reports that the skin around his scrotum has started to hurt and itch, he reports that he feels his testicles have shrunk. He is interested in learning about medication for erectile dysfunction.   No urinary issues today.    PSA trend: /15 3.94 7/16 4.58 2/17 5.27 10/17  5.1 10/18 6.04 6/19  7.93 12/19  7.49 11/20 7.04 6/21  8.16 9/21  9.31 6/22  6.4 12/22 1.9   PMH: Past Medical History:  Diagnosis Date   Arthritis    Diabetes mellitus    GERD (gastroesophageal reflux disease)    HLD (hyperlipidemia)    HTN (hypertension)    Obesity    Prostate disorder    Sleep apnea     Surgical History: Past Surgical History:  Procedure Laterality Date   ANKLE SURGERY     APPENDECTOMY     CARDIAC CATHETERIZATION     CATARACT EXTRACTION W/PHACO Left 05/01/2016   Procedure: CATARACT EXTRACTION PHACO AND INTRAOCULAR LENS PLACEMENT ([place]);  Surgeon: Eulogio Bear, MD;  Location: [facility] ORS;  Service:  Ophthalmology;  Laterality: Left;  Lot # [id number] H [place]: 01:35.1 AP%: 11.2 CDE: 11.11   CATARACT EXTRACTION W/PHACO Right 05/15/2016   Procedure: CATARACT EXTRACTION PHACO AND INTRAOCULAR LENS PLACEMENT ([facility]);  Surgeon: Eulogio Bear, MD;  Location: [facility] ORS;  Service: Ophthalmology;  Laterality: Right;  [place] 1:06.8 AP% 11.0 CDE 7.56 Fluid pack lot # [id number]   COLONOSCOPY     FRACTURE SURGERY     PROSTATE BIOPSY     TONSILLECTOMY      Home Medications:  Allergies as of 02/07/2021   No Known Allergies      Medication List        Accurate as of February 07, 2021 11:24 AM. If you have any questions, ask your nurse or doctor.          alfuzosin 10 MG 24 hr tablet Commonly known as: UROXATRAL   amLODipine 5 MG tablet Commonly known as: NORVASC   aspirin 81 MG tablet Take 81 mg by mouth daily. Reported on 06/15/2015   famotidine 20 MG tablet Commonly known as: PEPCID Take by mouth.   finasteride 5 MG tablet Commonly known as: PROSCAR Take 1 tablet (5 mg total) by mouth daily.   glipiZIDE 10 MG 24 hr tablet Commonly known as: GLUCOTROL XL Take 10 mg by mouth daily.   Lantus SoloStar 100 UNIT/ML Solostar Pen Generic drug: insulin  glargine   lisinopril 10 MG tablet Commonly known as: ZESTRIL Take 1 tablet by mouth daily.   metoprolol tartrate 50 MG tablet Commonly known as: LOPRESSOR Take 50 mg by mouth 2 (two) times daily.   NovoLIN 70/30 ReliOn (70-30) 100 UNIT/ML injection Generic drug: insulin NPH-regular Human Inject into the skin 2 (two) times daily with a meal. 30 TO 56 UNITS   PARoxetine 20 MG tablet Commonly known as: PAXIL   rosuvastatin 10 MG tablet Commonly known as: CRESTOR TAKE 1 TABLET ONE TIME DAILY        Allergies: No Known Allergies  Family History: Family History  Problem Relation Age of Onset   Heart attack Unknown    Colon cancer Mother    Prostate cancer Brother    Hypertension Unknown    Arthritis Unknown    Bladder  Cancer Neg Hx    Kidney cancer Neg Hx     Social History:  reports that he has quit smoking. His smoking use included cigarettes. He has never used smokeless tobacco. He reports current alcohol use. He reports that he does not use drugs.   Physical Exam: BP (!) 157/77    Pulse 61    Ht 5\' 6"  (1.676 m)    Wt 205 lb (93 kg)    BMI 33.09 kg/m   Constitutional:  Alert and oriented, No acute distress. HEENT: [place] AT, moist mucus membranes.  Trachea midline, no masses. Cardiovascular: No clubbing, cyanosis, or edema. Respiratory: Normal respiratory effort, no increased work of breathing. GU: area of eczema vs. excoriation seen on exam today, normal testicles  Skin: No rashes, bruises or suspicious lesions. Neurologic: Grossly intact, no focal deficits, moving all 4 extremities. Psychiatric: Normal mood and affect.  Assessment & Plan:    Prostate cancer  - Low risk prostate active surveillance - PSA remains within normal range, will manage conservatively - Plan for PSA in 6 months -continue finasteride   2. Erectile dysfunction  -We discussed the pathophysiology of erectile dysfunction today along with possible contributing factors. Discussed possible treatment options including PDE 5 inhibitors, vacuum erectile device, intracavernosal injection, MUSE, and placement of the inflatable or malleable penile prosthesis for refractory cases.  In terms of PDE 5 inhibitors, we discussed contraindications for this medication as well as common side effects. Patient was counseled on optimal use. All of his questions were answered in detail.  - He is not currently interested in any medications or injections.   3. Scrotal itching -recommend scrotal moisturizer  Return in 6 months with PSA/DRE  I,Kailey Littlejohn,acting as a scribe for Hollice Espy, MD.,have documented all relevant documentation on the behalf of Hollice Espy, MD,as directed by  Hollice Espy, MD while in the presence of Hollice Espy, MD.  I have reviewed the above documentation for accuracy and completeness, and I agree with the above.   Hollice Espy, MD   [facility] [address], [place], [place] [address] [phone]

## 2021-02-07 ENCOUNTER — Ambulatory Visit: Payer: Medicare HMO | Admitting: Urology

## 2021-02-07 ENCOUNTER — Other Ambulatory Visit: Payer: Self-pay

## 2021-02-07 VITALS — BP 157/77 | HR 61 | Ht 66.0 in | Wt 205.0 lb

## 2021-02-07 DIAGNOSIS — R972 Elevated prostate specific antigen [PSA]: Secondary | ICD-10-CM

## 2021-02-07 DIAGNOSIS — C61 Malignant neoplasm of prostate: Secondary | ICD-10-CM

## 2021-02-27 DIAGNOSIS — I10 Essential (primary) hypertension: Secondary | ICD-10-CM | POA: Diagnosis not present

## 2021-02-27 DIAGNOSIS — E669 Obesity, unspecified: Secondary | ICD-10-CM | POA: Diagnosis not present

## 2021-02-27 DIAGNOSIS — E1169 Type 2 diabetes mellitus with other specified complication: Secondary | ICD-10-CM | POA: Diagnosis not present

## 2021-02-27 DIAGNOSIS — E782 Mixed hyperlipidemia: Secondary | ICD-10-CM | POA: Diagnosis not present

## 2021-02-27 DIAGNOSIS — Z79899 Other long term (current) drug therapy: Secondary | ICD-10-CM | POA: Diagnosis not present

## 2021-03-05 DIAGNOSIS — N1832 Chronic kidney disease, stage 3b: Secondary | ICD-10-CM | POA: Diagnosis not present

## 2021-03-05 DIAGNOSIS — N183 Chronic kidney disease, stage 3 unspecified: Secondary | ICD-10-CM | POA: Diagnosis not present

## 2021-03-05 DIAGNOSIS — E669 Obesity, unspecified: Secondary | ICD-10-CM | POA: Diagnosis not present

## 2021-03-05 DIAGNOSIS — E782 Mixed hyperlipidemia: Secondary | ICD-10-CM | POA: Diagnosis not present

## 2021-03-05 DIAGNOSIS — E1122 Type 2 diabetes mellitus with diabetic chronic kidney disease: Secondary | ICD-10-CM | POA: Diagnosis not present

## 2021-03-05 DIAGNOSIS — Z794 Long term (current) use of insulin: Secondary | ICD-10-CM | POA: Diagnosis not present

## 2021-03-05 DIAGNOSIS — F419 Anxiety disorder, unspecified: Secondary | ICD-10-CM | POA: Diagnosis not present

## 2021-03-05 DIAGNOSIS — F32A Depression, unspecified: Secondary | ICD-10-CM | POA: Diagnosis not present

## 2021-03-05 DIAGNOSIS — E1169 Type 2 diabetes mellitus with other specified complication: Secondary | ICD-10-CM | POA: Diagnosis not present

## 2021-03-05 DIAGNOSIS — E1159 Type 2 diabetes mellitus with other circulatory complications: Secondary | ICD-10-CM | POA: Diagnosis not present

## 2021-03-05 DIAGNOSIS — I129 Hypertensive chronic kidney disease with stage 1 through stage 4 chronic kidney disease, or unspecified chronic kidney disease: Secondary | ICD-10-CM | POA: Diagnosis not present

## 2021-03-05 DIAGNOSIS — C61 Malignant neoplasm of prostate: Secondary | ICD-10-CM | POA: Diagnosis not present

## 2021-03-05 DIAGNOSIS — Z0000 Encounter for general adult medical examination without abnormal findings: Secondary | ICD-10-CM | POA: Diagnosis not present

## 2021-03-05 DIAGNOSIS — G4733 Obstructive sleep apnea (adult) (pediatric): Secondary | ICD-10-CM | POA: Diagnosis not present

## 2021-05-28 ENCOUNTER — Other Ambulatory Visit: Payer: Self-pay | Admitting: Urology

## 2021-06-06 DIAGNOSIS — N183 Chronic kidney disease, stage 3 unspecified: Secondary | ICD-10-CM | POA: Diagnosis not present

## 2021-06-06 DIAGNOSIS — E1122 Type 2 diabetes mellitus with diabetic chronic kidney disease: Secondary | ICD-10-CM | POA: Diagnosis not present

## 2021-06-06 DIAGNOSIS — Z79899 Other long term (current) drug therapy: Secondary | ICD-10-CM | POA: Diagnosis not present

## 2021-06-12 DIAGNOSIS — E669 Obesity, unspecified: Secondary | ICD-10-CM | POA: Diagnosis not present

## 2021-06-12 DIAGNOSIS — F419 Anxiety disorder, unspecified: Secondary | ICD-10-CM | POA: Diagnosis not present

## 2021-06-12 DIAGNOSIS — Z0000 Encounter for general adult medical examination without abnormal findings: Secondary | ICD-10-CM | POA: Diagnosis not present

## 2021-06-12 DIAGNOSIS — E785 Hyperlipidemia, unspecified: Secondary | ICD-10-CM | POA: Diagnosis not present

## 2021-06-12 DIAGNOSIS — N189 Chronic kidney disease, unspecified: Secondary | ICD-10-CM | POA: Diagnosis not present

## 2021-06-12 DIAGNOSIS — I129 Hypertensive chronic kidney disease with stage 1 through stage 4 chronic kidney disease, or unspecified chronic kidney disease: Secondary | ICD-10-CM | POA: Diagnosis not present

## 2021-06-12 DIAGNOSIS — F32A Depression, unspecified: Secondary | ICD-10-CM | POA: Diagnosis not present

## 2021-06-12 DIAGNOSIS — Z79899 Other long term (current) drug therapy: Secondary | ICD-10-CM | POA: Diagnosis not present

## 2021-06-12 DIAGNOSIS — E1122 Type 2 diabetes mellitus with diabetic chronic kidney disease: Secondary | ICD-10-CM | POA: Diagnosis not present

## 2021-06-25 ENCOUNTER — Other Ambulatory Visit: Payer: Self-pay | Admitting: *Deleted

## 2021-06-25 MED ORDER — FINASTERIDE 5 MG PO TABS: 5.0000 mg | ORAL_TABLET | Freq: Every day | ORAL | 3 refills | Status: DC

## 2021-08-08 ENCOUNTER — Other Ambulatory Visit: Payer: Medicare HMO

## 2021-08-08 DIAGNOSIS — C61 Malignant neoplasm of prostate: Secondary | ICD-10-CM | POA: Diagnosis not present

## 2021-08-08 DIAGNOSIS — R972 Elevated prostate specific antigen [PSA]: Secondary | ICD-10-CM

## 2021-08-09 LAB — PSA: Prostate Specific Ag, Serum: 2.3 ng/mL (ref 0.0–4.0)

## 2021-08-12 ENCOUNTER — Telehealth: Payer: Self-pay

## 2021-08-12 NOTE — Telephone Encounter (Signed)
Pt aware and verbalized understanding.  

## 2021-08-12 NOTE — Telephone Encounter (Signed)
-----   Message from Hollice Espy, MD sent at 08/12/2021  1:43 PM EDT ----- PSA is relatively stable.  We will continue to follow this.  Hollice Espy, MD

## 2021-09-12 DIAGNOSIS — E1159 Type 2 diabetes mellitus with other circulatory complications: Secondary | ICD-10-CM | POA: Diagnosis not present

## 2021-09-12 DIAGNOSIS — Z79899 Other long term (current) drug therapy: Secondary | ICD-10-CM | POA: Diagnosis not present

## 2021-09-12 DIAGNOSIS — E782 Mixed hyperlipidemia: Secondary | ICD-10-CM | POA: Diagnosis not present

## 2021-09-19 DIAGNOSIS — N401 Enlarged prostate with lower urinary tract symptoms: Secondary | ICD-10-CM | POA: Diagnosis not present

## 2021-09-19 DIAGNOSIS — N1832 Chronic kidney disease, stage 3b: Secondary | ICD-10-CM | POA: Diagnosis not present

## 2021-09-19 DIAGNOSIS — N138 Other obstructive and reflux uropathy: Secondary | ICD-10-CM | POA: Diagnosis not present

## 2021-09-19 DIAGNOSIS — F32A Depression, unspecified: Secondary | ICD-10-CM | POA: Diagnosis not present

## 2021-09-19 DIAGNOSIS — E1129 Type 2 diabetes mellitus with other diabetic kidney complication: Secondary | ICD-10-CM | POA: Diagnosis not present

## 2021-09-19 DIAGNOSIS — Z794 Long term (current) use of insulin: Secondary | ICD-10-CM | POA: Diagnosis not present

## 2021-09-19 DIAGNOSIS — N1831 Chronic kidney disease, stage 3a: Secondary | ICD-10-CM | POA: Diagnosis not present

## 2021-09-19 DIAGNOSIS — E669 Obesity, unspecified: Secondary | ICD-10-CM | POA: Diagnosis not present

## 2021-09-19 DIAGNOSIS — E1159 Type 2 diabetes mellitus with other circulatory complications: Secondary | ICD-10-CM | POA: Diagnosis not present

## 2021-09-19 DIAGNOSIS — F419 Anxiety disorder, unspecified: Secondary | ICD-10-CM | POA: Diagnosis not present

## 2021-09-19 DIAGNOSIS — E782 Mixed hyperlipidemia: Secondary | ICD-10-CM | POA: Diagnosis not present

## 2021-09-19 DIAGNOSIS — E1169 Type 2 diabetes mellitus with other specified complication: Secondary | ICD-10-CM | POA: Diagnosis not present

## 2021-09-19 DIAGNOSIS — E1122 Type 2 diabetes mellitus with diabetic chronic kidney disease: Secondary | ICD-10-CM | POA: Diagnosis not present

## 2021-09-19 DIAGNOSIS — R809 Proteinuria, unspecified: Secondary | ICD-10-CM | POA: Diagnosis not present

## 2021-09-19 DIAGNOSIS — I129 Hypertensive chronic kidney disease with stage 1 through stage 4 chronic kidney disease, or unspecified chronic kidney disease: Secondary | ICD-10-CM | POA: Diagnosis not present

## 2021-09-24 ENCOUNTER — Other Ambulatory Visit: Payer: Self-pay

## 2021-09-24 NOTE — Patient Outreach (Signed)
[facility]) Care Management  09/24/2021  Mario Robinson March 19, 1940 [id number]    Telephone Screen    Outreach call to patient to introduce [facility] services and assess care needs as part of benefit of PCP office and insurance plan. Spoke with patient who reports he is doing fairly well. Denies any acute issues or concerns at this time. Patient did not wish to talk at length with RN CM today.      Plan: RN CM will make outreach attempt to patient within 4 business days.   Enzo Montgomery, RN,BSN,CCM [place] [facility] Telephonic Care Management Coordinator Direct Phone: [phone] Toll Free: [phone] Fax: [phone]

## 2021-09-26 ENCOUNTER — Ambulatory Visit: Payer: Self-pay

## 2021-09-26 NOTE — Patient Outreach (Signed)
  Care Coordination   09/26/2021 Name: Mario Robinson MRN: [id number] DOB: 02-25-1940   Care Coordination Outreach Attempts:  A second unsuccessful outreach was attempted today to offer the patient with information about available care coordination services as a benefit of their health plan.     Follow Up Plan:  Additional outreach attempts will be made to offer the patient care coordination information and services.   Encounter Outcome:  No Answer  Care Coordination Interventions Activated:  No   Care Coordination Interventions:  No, not indicated    Noreene Larsson RN, MSN, CCM Community Care Coordinator [facility] Mobile: [phone]

## 2021-09-30 ENCOUNTER — Other Ambulatory Visit: Payer: Self-pay

## 2021-09-30 NOTE — Patient Outreach (Signed)
[place] [facility]) Care Management  09/30/2021  Mario Robinson 01/12/1941 [id number]   Telephone Screen       Unsuccessful outreach call to patient to introduce [facility] services and assess care needs as part of benefit of PCP office and insurance plan.      Plan: RN CM will make outreach attempt to patient within 3-4 wks if no response from letter mailed to patient.  Enzo Montgomery, RN,BSN,CCM [place] Management Telephonic Care Management Coordinator Direct Phone: [phone] Toll Free: [phone] Fax: [phone]

## 2021-10-02 ENCOUNTER — Other Ambulatory Visit: Payer: Self-pay

## 2021-10-02 NOTE — Patient Outreach (Signed)
[place] [facility]) Care Management  10/02/2021  OSMIN WELZ 02/17/1941 [id number]   Telephone Screen       Unsuccessful outreach call to patient to introduce [facility] services and assess care needs as part of benefit of PCP office and insurance plan.       Plan: RN CM will make outreach attempt to patient within 3-4 wks if no response from letter mailed to patient.  Enzo Montgomery, RN,BSN,CCM [place] Management Telephonic Care Management Coordinator Direct Phone: [phone] Toll Free: [phone] Fax: [phone]

## 2021-10-23 ENCOUNTER — Other Ambulatory Visit: Payer: Self-pay

## 2021-10-23 NOTE — Patient Outreach (Signed)
[facility]) Care Management  10/23/2021  DESMUND ELMAN September 17, 1940 [id number]   Telephone Screen     Multiple unsuccessful attempts to outreach patient to introduce [facility] services and assess care needs as part of benefit of PCP office and insurance plan.     Plan: RN CM will close case.   Enzo Montgomery, RN,BSN,CCM [place] Management Telephonic Care Management Coordinator Direct Phone: [phone] Toll Free: [phone] Fax: [phone]

## 2021-12-31 DIAGNOSIS — E1169 Type 2 diabetes mellitus with other specified complication: Secondary | ICD-10-CM | POA: Diagnosis not present

## 2021-12-31 DIAGNOSIS — E1159 Type 2 diabetes mellitus with other circulatory complications: Secondary | ICD-10-CM | POA: Diagnosis not present

## 2021-12-31 DIAGNOSIS — E782 Mixed hyperlipidemia: Secondary | ICD-10-CM | POA: Diagnosis not present

## 2022-01-07 DIAGNOSIS — N138 Other obstructive and reflux uropathy: Secondary | ICD-10-CM | POA: Diagnosis not present

## 2022-01-07 DIAGNOSIS — Z794 Long term (current) use of insulin: Secondary | ICD-10-CM | POA: Diagnosis not present

## 2022-01-07 DIAGNOSIS — E782 Mixed hyperlipidemia: Secondary | ICD-10-CM | POA: Diagnosis not present

## 2022-01-07 DIAGNOSIS — F32A Depression, unspecified: Secondary | ICD-10-CM | POA: Diagnosis not present

## 2022-01-07 DIAGNOSIS — E669 Obesity, unspecified: Secondary | ICD-10-CM | POA: Diagnosis not present

## 2022-01-07 DIAGNOSIS — E1169 Type 2 diabetes mellitus with other specified complication: Secondary | ICD-10-CM | POA: Diagnosis not present

## 2022-01-07 DIAGNOSIS — E1159 Type 2 diabetes mellitus with other circulatory complications: Secondary | ICD-10-CM | POA: Diagnosis not present

## 2022-01-07 DIAGNOSIS — E1129 Type 2 diabetes mellitus with other diabetic kidney complication: Secondary | ICD-10-CM | POA: Diagnosis not present

## 2022-01-07 DIAGNOSIS — E1122 Type 2 diabetes mellitus with diabetic chronic kidney disease: Secondary | ICD-10-CM | POA: Diagnosis not present

## 2022-01-07 DIAGNOSIS — N401 Enlarged prostate with lower urinary tract symptoms: Secondary | ICD-10-CM | POA: Diagnosis not present

## 2022-01-07 DIAGNOSIS — F419 Anxiety disorder, unspecified: Secondary | ICD-10-CM | POA: Diagnosis not present

## 2022-01-07 DIAGNOSIS — N1832 Chronic kidney disease, stage 3b: Secondary | ICD-10-CM | POA: Diagnosis not present

## 2022-01-07 DIAGNOSIS — Z79899 Other long term (current) drug therapy: Secondary | ICD-10-CM | POA: Diagnosis not present

## 2022-01-07 DIAGNOSIS — I129 Hypertensive chronic kidney disease with stage 1 through stage 4 chronic kidney disease, or unspecified chronic kidney disease: Secondary | ICD-10-CM | POA: Diagnosis not present

## 2022-01-07 DIAGNOSIS — R809 Proteinuria, unspecified: Secondary | ICD-10-CM | POA: Diagnosis not present

## 2022-01-07 DIAGNOSIS — N183 Chronic kidney disease, stage 3 unspecified: Secondary | ICD-10-CM | POA: Diagnosis not present

## 2022-02-04 ENCOUNTER — Other Ambulatory Visit: Payer: Medicare HMO

## 2022-02-04 DIAGNOSIS — C61 Malignant neoplasm of prostate: Secondary | ICD-10-CM | POA: Diagnosis not present

## 2022-02-04 DIAGNOSIS — R972 Elevated prostate specific antigen [PSA]: Secondary | ICD-10-CM | POA: Diagnosis not present

## 2022-02-05 LAB — PSA: Prostate Specific Ag, Serum: 1.8 ng/mL (ref 0.0–4.0)

## 2022-02-12 ENCOUNTER — Ambulatory Visit: Payer: Medicare HMO | Admitting: Urology

## 2022-02-12 VITALS — BP 122/68 | HR 71 | Ht 66.0 in | Wt 216.1 lb

## 2022-02-12 DIAGNOSIS — N401 Enlarged prostate with lower urinary tract symptoms: Secondary | ICD-10-CM | POA: Diagnosis not present

## 2022-02-12 DIAGNOSIS — C61 Malignant neoplasm of prostate: Secondary | ICD-10-CM | POA: Diagnosis not present

## 2022-02-12 DIAGNOSIS — R972 Elevated prostate specific antigen [PSA]: Secondary | ICD-10-CM | POA: Diagnosis not present

## 2022-02-12 DIAGNOSIS — R351 Nocturia: Secondary | ICD-10-CM | POA: Diagnosis not present

## 2022-02-12 NOTE — Progress Notes (Incomplete)
I, DeAsia L Maxie,acting as a scribe for Hollice Espy, MD.,have documented all relevant documentation on the behalf of Hollice Espy, MD,as directed by  Hollice Espy, MD while in the presence of Hollice Espy, MD.   02/12/22 12:02 PM   De Burrs 1940/03/21 [id number]  Referring provider: Idelle Crouch, MD [place] [facility] [place],  [place] [address]  Chief Complaint  Patient presents with   Prostate Cancer    HPI: 81 year-old male with a personal history of low-risk prostate cancer on active surveillance and BPH, returns today for his annual follow-up.   He initially presented with an elevated/rising PSA.  He underwent a negative prostate biopsy in 2017 with chronic inflammation.     Ultimately, he underwent MRI (PI-RADS 5 lesion measuring 3.8 cm along the right anterior prostate) of followed by an MRI fusion biopsy for which she returns with the results today.  Prostate volume approximately 70 g.   Area of interest in MRI revealed benign prostatic tissue.  He did have incidental focus of 1 core Gleason 3+3 at the right apex, 5% of the tissue.  His most recent PSA is stable at 1.8 on 02/04/22.  He has been on Finasteride for a year and a half. He is also on Uroxatral.   Today he complaints of urgency and frequency which is likely due to increased fluids.   PMH: Past Medical History:  Diagnosis Date   Arthritis    Diabetes mellitus    GERD (gastroesophageal reflux disease)    HLD (hyperlipidemia)    HTN (hypertension)    Obesity    Prostate disorder    Sleep apnea     Surgical History: Past Surgical History:  Procedure Laterality Date   ANKLE SURGERY     APPENDECTOMY     CARDIAC CATHETERIZATION     CATARACT EXTRACTION W/PHACO Left 05/01/2016   Procedure: CATARACT EXTRACTION PHACO AND INTRAOCULAR LENS PLACEMENT ([place]);  Surgeon: Eulogio Bear, MD;  Location: [facility] ORS;  Service: Ophthalmology;  Laterality: Left;  Lot #  [id number] H [place]: 01:35.1 AP%: 11.2 CDE: 11.11   CATARACT EXTRACTION W/PHACO Right 05/15/2016   Procedure: CATARACT EXTRACTION PHACO AND INTRAOCULAR LENS PLACEMENT ([facility]);  Surgeon: Eulogio Bear, MD;  Location: [facility] ORS;  Service: Ophthalmology;  Laterality: Right;  [place] 1:06.8 AP% 11.0 CDE 7.56 Fluid pack lot # [id number]   COLONOSCOPY     FRACTURE SURGERY     PROSTATE BIOPSY     TONSILLECTOMY      Home Medications:  Allergies as of 02/12/2022   No Known Allergies      Medication List        Accurate as of February 12, 2022 12:02 PM. If you have any questions, ask your nurse or doctor.          alfuzosin 10 MG 24 hr tablet Commonly known as: UROXATRAL   amLODipine 5 MG tablet Commonly known as: NORVASC   aspirin 81 MG tablet Take 81 mg by mouth daily. Reported on 06/15/2015   famotidine 20 MG tablet Commonly known as: PEPCID Take by mouth.   finasteride 5 MG tablet Commonly known as: PROSCAR Take 1 tablet (5 mg total) by mouth daily.   glipiZIDE 10 MG 24 hr tablet Commonly known as: GLUCOTROL XL Take 10 mg by mouth daily.   Lantus SoloStar 100 UNIT/ML Solostar Pen Generic drug: insulin glargine   lisinopril 10 MG tablet Commonly known as: ZESTRIL Take 1 tablet by mouth daily.  metoprolol tartrate 50 MG tablet Commonly known as: LOPRESSOR Take 50 mg by mouth 2 (two) times daily.   NovoLIN 70/30 ReliOn (70-30) 100 UNIT/ML injection Generic drug: insulin NPH-regular Human Inject into the skin 2 (two) times daily with a meal. 15 UNITS   PARoxetine 20 MG tablet Commonly known as: PAXIL   rosuvastatin 10 MG tablet Commonly known as: CRESTOR TAKE 1 TABLET ONE TIME DAILY        Family History: Family History  Problem Relation Age of Onset   Heart attack Unknown    Colon cancer Mother    Prostate cancer Brother    Hypertension Unknown    Arthritis Unknown    Bladder Cancer Neg Hx    Kidney cancer Neg Hx     Social History:  reports that he  has quit smoking. His smoking use included cigarettes. He has never used smokeless tobacco. He reports current alcohol use. He reports that he does not use drugs.   Physical Exam: BP 122/68   Pulse 71   Ht '5\' 6"'$  (1.676 m)   Wt 216 lb 2 oz (98 kg)   BMI 34.88 kg/m   Constitutional:  Alert and oriented, No acute distress. HEENT: [place] AT, moist mucus membranes.  Trachea midline, no masses. GU: 50 gram prostate, no nodules Neurologic: Grossly intact, no focal deficits, moving all 4 extremities. Psychiatric: Normal mood and affect.  Assessment & Plan:    Prostate cancer - Low risk, low volume disease stable on active surveillance. We will decrease surveillance to annually with DRE.  2. BPH with nocturia - Discussed behavioral modifications - Continue Uroxatral and Finasteride   Return in about 1 year (around 02/13/2023).   [facility] [address] [place], [place] [address] [phone]

## 2022-02-27 DIAGNOSIS — Z0100 Encounter for examination of eyes and vision without abnormal findings: Secondary | ICD-10-CM | POA: Diagnosis not present

## 2022-02-27 DIAGNOSIS — Z961 Presence of intraocular lens: Secondary | ICD-10-CM | POA: Diagnosis not present

## 2022-04-10 DIAGNOSIS — I10 Essential (primary) hypertension: Secondary | ICD-10-CM | POA: Diagnosis not present

## 2022-04-10 DIAGNOSIS — Z79899 Other long term (current) drug therapy: Secondary | ICD-10-CM | POA: Diagnosis not present

## 2022-04-10 DIAGNOSIS — N1832 Chronic kidney disease, stage 3b: Secondary | ICD-10-CM | POA: Diagnosis not present

## 2022-04-10 DIAGNOSIS — E1122 Type 2 diabetes mellitus with diabetic chronic kidney disease: Secondary | ICD-10-CM | POA: Diagnosis not present

## 2022-04-10 DIAGNOSIS — Z794 Long term (current) use of insulin: Secondary | ICD-10-CM | POA: Diagnosis not present

## 2022-04-17 DIAGNOSIS — R809 Proteinuria, unspecified: Secondary | ICD-10-CM | POA: Diagnosis not present

## 2022-04-17 DIAGNOSIS — N1832 Chronic kidney disease, stage 3b: Secondary | ICD-10-CM | POA: Diagnosis not present

## 2022-04-17 DIAGNOSIS — N1831 Chronic kidney disease, stage 3a: Secondary | ICD-10-CM | POA: Diagnosis not present

## 2022-04-17 DIAGNOSIS — Z794 Long term (current) use of insulin: Secondary | ICD-10-CM | POA: Diagnosis not present

## 2022-04-17 DIAGNOSIS — E1129 Type 2 diabetes mellitus with other diabetic kidney complication: Secondary | ICD-10-CM | POA: Diagnosis not present

## 2022-04-17 DIAGNOSIS — E119 Type 2 diabetes mellitus without complications: Secondary | ICD-10-CM | POA: Diagnosis not present

## 2022-04-17 DIAGNOSIS — Z0000 Encounter for general adult medical examination without abnormal findings: Secondary | ICD-10-CM | POA: Diagnosis not present

## 2022-04-17 DIAGNOSIS — E1169 Type 2 diabetes mellitus with other specified complication: Secondary | ICD-10-CM | POA: Diagnosis not present

## 2022-04-17 DIAGNOSIS — E669 Obesity, unspecified: Secondary | ICD-10-CM | POA: Diagnosis not present

## 2022-04-17 DIAGNOSIS — E785 Hyperlipidemia, unspecified: Secondary | ICD-10-CM | POA: Diagnosis not present

## 2022-04-17 DIAGNOSIS — I10 Essential (primary) hypertension: Secondary | ICD-10-CM | POA: Diagnosis not present

## 2022-04-17 DIAGNOSIS — E782 Mixed hyperlipidemia: Secondary | ICD-10-CM | POA: Diagnosis not present

## 2022-04-17 DIAGNOSIS — E1159 Type 2 diabetes mellitus with other circulatory complications: Secondary | ICD-10-CM | POA: Diagnosis not present

## 2022-04-17 DIAGNOSIS — Z79899 Other long term (current) drug therapy: Secondary | ICD-10-CM | POA: Diagnosis not present

## 2022-04-17 DIAGNOSIS — E1122 Type 2 diabetes mellitus with diabetic chronic kidney disease: Secondary | ICD-10-CM | POA: Diagnosis not present

## 2022-07-10 DIAGNOSIS — Z79899 Other long term (current) drug therapy: Secondary | ICD-10-CM | POA: Diagnosis not present

## 2022-07-10 DIAGNOSIS — N183 Chronic kidney disease, stage 3 unspecified: Secondary | ICD-10-CM | POA: Diagnosis not present

## 2022-07-10 DIAGNOSIS — E1122 Type 2 diabetes mellitus with diabetic chronic kidney disease: Secondary | ICD-10-CM | POA: Diagnosis not present

## 2022-07-17 DIAGNOSIS — Z794 Long term (current) use of insulin: Secondary | ICD-10-CM | POA: Diagnosis not present

## 2022-07-17 DIAGNOSIS — E782 Mixed hyperlipidemia: Secondary | ICD-10-CM | POA: Diagnosis not present

## 2022-07-17 DIAGNOSIS — E1122 Type 2 diabetes mellitus with diabetic chronic kidney disease: Secondary | ICD-10-CM | POA: Diagnosis not present

## 2022-07-17 DIAGNOSIS — Z79899 Other long term (current) drug therapy: Secondary | ICD-10-CM | POA: Diagnosis not present

## 2022-07-17 DIAGNOSIS — Z87891 Personal history of nicotine dependence: Secondary | ICD-10-CM | POA: Diagnosis not present

## 2022-07-17 DIAGNOSIS — I129 Hypertensive chronic kidney disease with stage 1 through stage 4 chronic kidney disease, or unspecified chronic kidney disease: Secondary | ICD-10-CM | POA: Diagnosis not present

## 2022-07-17 DIAGNOSIS — N183 Chronic kidney disease, stage 3 unspecified: Secondary | ICD-10-CM | POA: Diagnosis not present

## 2022-07-24 DIAGNOSIS — R208 Other disturbances of skin sensation: Secondary | ICD-10-CM | POA: Diagnosis not present

## 2022-07-24 DIAGNOSIS — L309 Dermatitis, unspecified: Secondary | ICD-10-CM | POA: Diagnosis not present

## 2022-07-24 DIAGNOSIS — D485 Neoplasm of uncertain behavior of skin: Secondary | ICD-10-CM | POA: Diagnosis not present

## 2022-07-24 DIAGNOSIS — R58 Hemorrhage, not elsewhere classified: Secondary | ICD-10-CM | POA: Diagnosis not present

## 2022-07-24 DIAGNOSIS — L538 Other specified erythematous conditions: Secondary | ICD-10-CM | POA: Diagnosis not present

## 2022-07-24 DIAGNOSIS — D225 Melanocytic nevi of trunk: Secondary | ICD-10-CM | POA: Diagnosis not present

## 2022-07-24 DIAGNOSIS — L218 Other seborrheic dermatitis: Secondary | ICD-10-CM | POA: Diagnosis not present

## 2022-07-24 DIAGNOSIS — L820 Inflamed seborrheic keratosis: Secondary | ICD-10-CM | POA: Diagnosis not present

## 2022-07-24 DIAGNOSIS — L738 Other specified follicular disorders: Secondary | ICD-10-CM | POA: Diagnosis not present

## 2022-09-03 ENCOUNTER — Other Ambulatory Visit: Payer: Self-pay | Admitting: Urology

## 2022-09-05 ENCOUNTER — Other Ambulatory Visit: Payer: Self-pay | Admitting: Urology

## 2022-10-16 DIAGNOSIS — E1129 Type 2 diabetes mellitus with other diabetic kidney complication: Secondary | ICD-10-CM | POA: Diagnosis not present

## 2022-10-16 DIAGNOSIS — Z794 Long term (current) use of insulin: Secondary | ICD-10-CM | POA: Diagnosis not present

## 2022-10-16 DIAGNOSIS — R809 Proteinuria, unspecified: Secondary | ICD-10-CM | POA: Diagnosis not present

## 2022-10-16 DIAGNOSIS — N1832 Chronic kidney disease, stage 3b: Secondary | ICD-10-CM | POA: Diagnosis not present

## 2022-10-16 DIAGNOSIS — E1122 Type 2 diabetes mellitus with diabetic chronic kidney disease: Secondary | ICD-10-CM | POA: Diagnosis not present

## 2022-10-16 DIAGNOSIS — E1159 Type 2 diabetes mellitus with other circulatory complications: Secondary | ICD-10-CM | POA: Diagnosis not present

## 2022-10-16 DIAGNOSIS — E782 Mixed hyperlipidemia: Secondary | ICD-10-CM | POA: Diagnosis not present

## 2022-10-16 DIAGNOSIS — E1169 Type 2 diabetes mellitus with other specified complication: Secondary | ICD-10-CM | POA: Diagnosis not present

## 2022-10-28 DIAGNOSIS — N1832 Chronic kidney disease, stage 3b: Secondary | ICD-10-CM | POA: Diagnosis not present

## 2022-10-28 DIAGNOSIS — Z794 Long term (current) use of insulin: Secondary | ICD-10-CM | POA: Diagnosis not present

## 2022-10-28 DIAGNOSIS — E1169 Type 2 diabetes mellitus with other specified complication: Secondary | ICD-10-CM | POA: Diagnosis not present

## 2022-10-28 DIAGNOSIS — E782 Mixed hyperlipidemia: Secondary | ICD-10-CM | POA: Diagnosis not present

## 2022-10-28 DIAGNOSIS — E1159 Type 2 diabetes mellitus with other circulatory complications: Secondary | ICD-10-CM | POA: Diagnosis not present

## 2022-10-28 DIAGNOSIS — R809 Proteinuria, unspecified: Secondary | ICD-10-CM | POA: Diagnosis not present

## 2022-10-28 DIAGNOSIS — E1122 Type 2 diabetes mellitus with diabetic chronic kidney disease: Secondary | ICD-10-CM | POA: Diagnosis not present

## 2022-10-28 DIAGNOSIS — E1129 Type 2 diabetes mellitus with other diabetic kidney complication: Secondary | ICD-10-CM | POA: Diagnosis not present

## 2022-11-10 DIAGNOSIS — Z79899 Other long term (current) drug therapy: Secondary | ICD-10-CM | POA: Diagnosis not present

## 2022-11-10 DIAGNOSIS — E782 Mixed hyperlipidemia: Secondary | ICD-10-CM | POA: Diagnosis not present

## 2022-11-10 DIAGNOSIS — I10 Essential (primary) hypertension: Secondary | ICD-10-CM | POA: Diagnosis not present

## 2022-11-14 DIAGNOSIS — I129 Hypertensive chronic kidney disease with stage 1 through stage 4 chronic kidney disease, or unspecified chronic kidney disease: Secondary | ICD-10-CM | POA: Diagnosis not present

## 2022-11-14 DIAGNOSIS — Z794 Long term (current) use of insulin: Secondary | ICD-10-CM | POA: Diagnosis not present

## 2022-11-14 DIAGNOSIS — N183 Chronic kidney disease, stage 3 unspecified: Secondary | ICD-10-CM | POA: Diagnosis not present

## 2022-11-14 DIAGNOSIS — E1122 Type 2 diabetes mellitus with diabetic chronic kidney disease: Secondary | ICD-10-CM | POA: Diagnosis not present

## 2022-11-14 DIAGNOSIS — E782 Mixed hyperlipidemia: Secondary | ICD-10-CM | POA: Diagnosis not present

## 2022-11-14 DIAGNOSIS — F419 Anxiety disorder, unspecified: Secondary | ICD-10-CM | POA: Diagnosis not present

## 2022-11-14 DIAGNOSIS — Z79899 Other long term (current) drug therapy: Secondary | ICD-10-CM | POA: Diagnosis not present

## 2022-11-14 DIAGNOSIS — Z87891 Personal history of nicotine dependence: Secondary | ICD-10-CM | POA: Diagnosis not present

## 2022-11-14 DIAGNOSIS — F32A Depression, unspecified: Secondary | ICD-10-CM | POA: Diagnosis not present

## 2023-02-06 ENCOUNTER — Other Ambulatory Visit: Payer: Medicare HMO

## 2023-02-11 ENCOUNTER — Ambulatory Visit: Payer: Medicare HMO | Admitting: Urology

## 2023-03-04 DIAGNOSIS — H04123 Dry eye syndrome of bilateral lacrimal glands: Secondary | ICD-10-CM | POA: Diagnosis not present

## 2023-03-04 DIAGNOSIS — H4321 Crystalline deposits in vitreous body, right eye: Secondary | ICD-10-CM | POA: Diagnosis not present

## 2023-03-04 DIAGNOSIS — E119 Type 2 diabetes mellitus without complications: Secondary | ICD-10-CM | POA: Diagnosis not present

## 2023-03-04 DIAGNOSIS — H26491 Other secondary cataract, right eye: Secondary | ICD-10-CM | POA: Diagnosis not present

## 2023-03-09 DIAGNOSIS — N183 Chronic kidney disease, stage 3 unspecified: Secondary | ICD-10-CM | POA: Diagnosis not present

## 2023-03-09 DIAGNOSIS — E1122 Type 2 diabetes mellitus with diabetic chronic kidney disease: Secondary | ICD-10-CM | POA: Diagnosis not present

## 2023-03-09 DIAGNOSIS — Z79899 Other long term (current) drug therapy: Secondary | ICD-10-CM | POA: Diagnosis not present

## 2023-03-09 DIAGNOSIS — I10 Essential (primary) hypertension: Secondary | ICD-10-CM | POA: Diagnosis not present

## 2023-03-16 DIAGNOSIS — Z125 Encounter for screening for malignant neoplasm of prostate: Secondary | ICD-10-CM | POA: Diagnosis not present

## 2023-03-16 DIAGNOSIS — N1832 Chronic kidney disease, stage 3b: Secondary | ICD-10-CM | POA: Diagnosis not present

## 2023-03-16 DIAGNOSIS — M25552 Pain in left hip: Secondary | ICD-10-CM | POA: Diagnosis not present

## 2023-03-16 DIAGNOSIS — E119 Type 2 diabetes mellitus without complications: Secondary | ICD-10-CM | POA: Diagnosis not present

## 2023-03-16 DIAGNOSIS — Z79899 Other long term (current) drug therapy: Secondary | ICD-10-CM | POA: Diagnosis not present

## 2023-03-16 DIAGNOSIS — E1159 Type 2 diabetes mellitus with other circulatory complications: Secondary | ICD-10-CM | POA: Diagnosis not present

## 2023-03-16 DIAGNOSIS — I10 Essential (primary) hypertension: Secondary | ICD-10-CM | POA: Diagnosis not present

## 2023-03-16 DIAGNOSIS — Z0000 Encounter for general adult medical examination without abnormal findings: Secondary | ICD-10-CM | POA: Diagnosis not present

## 2023-03-16 DIAGNOSIS — Z794 Long term (current) use of insulin: Secondary | ICD-10-CM | POA: Diagnosis not present

## 2023-03-16 DIAGNOSIS — E785 Hyperlipidemia, unspecified: Secondary | ICD-10-CM | POA: Diagnosis not present

## 2023-03-16 DIAGNOSIS — N400 Enlarged prostate without lower urinary tract symptoms: Secondary | ICD-10-CM | POA: Diagnosis not present

## 2023-03-16 DIAGNOSIS — E669 Obesity, unspecified: Secondary | ICD-10-CM | POA: Diagnosis not present

## 2023-03-16 DIAGNOSIS — E1122 Type 2 diabetes mellitus with diabetic chronic kidney disease: Secondary | ICD-10-CM | POA: Diagnosis not present

## 2023-03-16 DIAGNOSIS — E782 Mixed hyperlipidemia: Secondary | ICD-10-CM | POA: Diagnosis not present

## 2023-03-16 DIAGNOSIS — E1169 Type 2 diabetes mellitus with other specified complication: Secondary | ICD-10-CM | POA: Diagnosis not present

## 2023-03-24 DIAGNOSIS — M51369 Other intervertebral disc degeneration, lumbar region without mention of lumbar back pain or lower extremity pain: Secondary | ICD-10-CM | POA: Diagnosis not present

## 2023-03-24 DIAGNOSIS — M4316 Spondylolisthesis, lumbar region: Secondary | ICD-10-CM | POA: Diagnosis not present

## 2023-03-24 DIAGNOSIS — G8929 Other chronic pain: Secondary | ICD-10-CM | POA: Diagnosis not present

## 2023-03-24 DIAGNOSIS — M5442 Lumbago with sciatica, left side: Secondary | ICD-10-CM | POA: Diagnosis not present

## 2023-03-24 DIAGNOSIS — M25552 Pain in left hip: Secondary | ICD-10-CM | POA: Diagnosis not present

## 2023-03-24 DIAGNOSIS — M47816 Spondylosis without myelopathy or radiculopathy, lumbar region: Secondary | ICD-10-CM | POA: Diagnosis not present

## 2023-07-20 ENCOUNTER — Other Ambulatory Visit: Payer: Self-pay | Admitting: Urology

## 2023-09-07 DIAGNOSIS — I10 Essential (primary) hypertension: Secondary | ICD-10-CM | POA: Diagnosis not present

## 2023-09-07 DIAGNOSIS — E782 Mixed hyperlipidemia: Secondary | ICD-10-CM | POA: Diagnosis not present

## 2023-09-07 DIAGNOSIS — Z125 Encounter for screening for malignant neoplasm of prostate: Secondary | ICD-10-CM | POA: Diagnosis not present

## 2023-09-07 DIAGNOSIS — E1122 Type 2 diabetes mellitus with diabetic chronic kidney disease: Secondary | ICD-10-CM | POA: Diagnosis not present

## 2023-09-07 DIAGNOSIS — N183 Chronic kidney disease, stage 3 unspecified: Secondary | ICD-10-CM | POA: Diagnosis not present

## 2023-09-07 DIAGNOSIS — Z79899 Other long term (current) drug therapy: Secondary | ICD-10-CM | POA: Diagnosis not present

## 2023-09-14 DIAGNOSIS — I2510 Atherosclerotic heart disease of native coronary artery without angina pectoris: Secondary | ICD-10-CM | POA: Diagnosis not present

## 2023-09-14 DIAGNOSIS — N401 Enlarged prostate with lower urinary tract symptoms: Secondary | ICD-10-CM | POA: Diagnosis not present

## 2023-09-14 DIAGNOSIS — I129 Hypertensive chronic kidney disease with stage 1 through stage 4 chronic kidney disease, or unspecified chronic kidney disease: Secondary | ICD-10-CM | POA: Diagnosis not present

## 2023-09-14 DIAGNOSIS — Z794 Long term (current) use of insulin: Secondary | ICD-10-CM | POA: Diagnosis not present

## 2023-09-14 DIAGNOSIS — Z0000 Encounter for general adult medical examination without abnormal findings: Secondary | ICD-10-CM | POA: Diagnosis not present

## 2023-09-14 DIAGNOSIS — E782 Mixed hyperlipidemia: Secondary | ICD-10-CM | POA: Diagnosis not present

## 2023-09-14 DIAGNOSIS — R058 Other specified cough: Secondary | ICD-10-CM | POA: Diagnosis not present

## 2023-09-14 DIAGNOSIS — E1129 Type 2 diabetes mellitus with other diabetic kidney complication: Secondary | ICD-10-CM | POA: Diagnosis not present

## 2023-09-14 DIAGNOSIS — E66812 Obesity, class 2: Secondary | ICD-10-CM | POA: Diagnosis not present

## 2023-09-14 DIAGNOSIS — R809 Proteinuria, unspecified: Secondary | ICD-10-CM | POA: Diagnosis not present

## 2023-09-14 DIAGNOSIS — N1832 Chronic kidney disease, stage 3b: Secondary | ICD-10-CM | POA: Diagnosis not present

## 2023-09-14 DIAGNOSIS — Z1331 Encounter for screening for depression: Secondary | ICD-10-CM | POA: Diagnosis not present

## 2023-09-14 DIAGNOSIS — E1122 Type 2 diabetes mellitus with diabetic chronic kidney disease: Secondary | ICD-10-CM | POA: Diagnosis not present

## 2023-09-14 DIAGNOSIS — E1169 Type 2 diabetes mellitus with other specified complication: Secondary | ICD-10-CM | POA: Diagnosis not present

## 2023-09-14 DIAGNOSIS — E1159 Type 2 diabetes mellitus with other circulatory complications: Secondary | ICD-10-CM | POA: Diagnosis not present

## 2023-09-14 DIAGNOSIS — Z79899 Other long term (current) drug therapy: Secondary | ICD-10-CM | POA: Diagnosis not present

## 2023-09-14 DIAGNOSIS — E669 Obesity, unspecified: Secondary | ICD-10-CM | POA: Diagnosis not present

## 2023-09-14 NOTE — Progress Notes (Signed)
 Mario Robinson is a  83 y.o. male who presents for  CHIEF COMPLAINT Chief Complaint  Patient presents with  . Annual Exam    Subjective: History of Present Illness  Pt in NAD. Here for yearly eval. Obese with BMI>37. HTN stable on meds. Has HLD on statin, DM on insulin and BPH on meds(seeing Urology). Also with CKD and anxiety with insomnia. Weight stable. Sleeping OK with meds. No fever or HA's. Denies CP or SOB.No palpitations.. No change in bowels. Does c/o urinary urgency/incontinence. Also with cough with gray sputum.    Past Medical History:  Diagnosis Date  . Anxiety and depression   . BPH (benign prostatic hyperplasia)   . Change in bowel habits 10/14/2018  . Cholelithiasis   . Chronic back pain   . CKD (chronic kidney disease), stage III (CMS/HHS-HCC)    stable  . DDD (degenerative disc disease)   . Diverticulosis of colon (without mention of hemorrhage)   . DM type 2, uncontrolled, with renal complications   . Environmental allergies   . Fatty infiltration of liver 12/22/2013  . Fatty liver   . FH: colon cancer 12/22/2013  . GERD (gastroesophageal reflux disease)   . Hip pain    not likely due to statin  . History of adenomatous polyp of colon 12/22/2013  . Hyperlipemia   . Hypertension, well controlled   . Long-term insulin use (CMS/HHS-HCC)   . Obesity   . OM (onychomycosis)   . Sleep apnea    not on cpap   Patient Active Problem List  Diagnosis  . HTN (hypertension)  . Obesity  . Hyperlipidemia  . DDD (degenerative disc disease)  . BPH with obstruction/lower urinary tract symptoms  . OSA (obstructive sleep apnea)  . Anxiety and depression  . History of adenomatous polyp of colon  . FH: colon cancer  . Fatty infiltration of liver  . Type 2 diabetes mellitus with stage 3 chronic kidney disease (CMS/HHS-HCC)  . Elevated PSA  . Change in bowel habits  . Coronary arteriosclerosis    Past Surgical History:  Procedure Laterality Date  . EGD  02/12/2001   . Cardiac Catheterization  11/23/2012  . COLONOSCOPY  02/02/2014  . Ankle Surgery Left   . APPENDECTOMY    . COLONOSCOPY  02/02/2014, 11/09/2008, 03/20/2005, 02/12/2001   Adenomatous Polyp, [facility] (Mother): [facility] 01/2019     Current Outpatient Medications:  .  ACCU-CHEK AVIVA PLUS TEST STRP test strip, TEST THREE TIMES DAILY, Disp: 300 strip, Rfl: 3 .  alfuzosin (UROXATRAL) 10 mg ER tablet, TAKE 1 TABLET EVERY DAY, Disp: 90 tablet, Rfl: 3 .  amLODIPine (NORVASC) 5 MG tablet, TAKE 1 TABLET EVERY DAY, Disp: 90 tablet, Rfl: 3 .  aspirin 81 MG EC tablet, Take 81 mg by mouth once daily., Disp: , Rfl:  .  blood glucose diagnostic (ACCU-CHEK GUIDE TEST STRIPS) test strip, 1 each (1 strip total) 3 (three) times daily Use as instructed., Disp: 100 each, Rfl: 12 .  famotidine (PEPCID) 20 MG tablet, Take 20 mg by mouth 2 (two) times daily, Disp: , Rfl:  .  finasteride  (PROSCAR ) 5 mg tablet, Take 1 tablet (5 mg total) by mouth once daily, Disp: 90 tablet, Rfl: 1 .  glipiZIDE (GLUCOTROL) 10 MG tablet, Take 1 tablet (10 mg total) by mouth 2 (two) times daily before meals, Disp: 180 tablet, Rfl: 3 .  LANTUS SOLOSTAR U-100 INSULIN pen injector (concentration 100 units/mL), Inject up to 15-20 units at bedtime depending on blood  sugar, Disp: 30 mL, Rfl: 2 .  levocetirizine (XYZAL) 5 MG tablet, Take 1 tablet (5 mg total) by mouth at bedtime, Disp: 90 tablet, Rfl: 3 .  losartan (COZAAR) 100 MG tablet, TAKE 1 TABLET EVERY DAY, Disp: 90 tablet, Rfl: 3 .  QUEtiapine (SEROQUEL) 25 MG tablet, TAKE 1 TABLET AT BEDTIME, Disp: 90 tablet, Rfl: 3 .  rosuvastatin (CRESTOR) 10 MG tablet, TAKE 1 TABLET EVERY DAY, Disp: 90 tablet, Rfl: 3 .  blood glucose meter kit, Use as directed, Disp: 1 each, Rfl: 0 .  blood-glucose meter (ACCU-CHEK AVIVA PLUS METER MISC), Use, Disp: , Rfl:  .  lancets (ACCU-CHEK SOFTCLIX LANCETS), TEST BLOOD SUGAR THREE TIMES DAILY AS DIRECTED, Disp: 300 each, Rfl: 1  Patient has no known  allergies.  Social History   Socioeconomic History  . Marital status: Married  . Number of children: 2  Occupational History  . Occupation: retired  Tobacco Use  . Smoking status: Former    Types: Cigarettes, Cigars  . Smokeless tobacco: Never  Vaping Use  . Vaping status: Unknown  Substance and Sexual Activity  . Alcohol use: Not Currently    Alcohol/week: 0.0 standard drinks of alcohol    Comment: seldom  . Drug use: No   Social Drivers of Corporate investment banker Strain: Low Risk  (09/14/2023)   Overall Financial Resource Strain (CARDIA)   . Difficulty of Paying Living Expenses: Not hard at all  Food Insecurity: No Food Insecurity (09/14/2023)   Hunger Vital Sign   . Worried About Programme researcher, broadcasting/film/video in the Last Year: Never true   . Ran Out of Food in the Last Year: Never true  Transportation Needs: No Transportation Needs (09/14/2023)   PRAPARE - Transportation   . Lack of Transportation (Medical): No   . Lack of Transportation (Non-Medical): No  Housing Stability: Low Risk  (09/14/2023)   Housing Stability Vital Sign   . Unable to Pay for Housing in the Last Year: No   . Number of Times Moved in the Last Year: 0   . Homeless in the Last Year: No    Family History  Problem Relation Name Age of Onset  . Prostate cancer Brother    . High blood pressure (Hypertension) Brother    . Myocardial Infarction (Heart attack) Father    . Colon cancer Mother    . Prostate cancer Brother    . Skin cancer Sister      A comprehensive ROS was negative except for HPI  PE: BP 136/80   Pulse 77   Ht 162.6 cm (5' 4)   Wt 99.8 kg (220 lb)   SpO2 96%   BMI 37.76 kg/m  General. Alert oriented x3  Skin. No suspicious lesions or moles.   Eyes. Sclera and conjunctiva clear; pupils equal round and reactive to light and accommodation; extraocular movements intact Ears. External normal; canals clear; tympanic membranes normal Nose. Mucosa healthy without drainage or  ulceration Oropharynx. No suspicious lesions Neck. No swelling, masses, stiffness, pain, limited movement, carotid pulses normal bilaterally, thyroid  normal size, no masses palpated.  No bruits Lungs. Respirations unlabored; clear to auscultation bilaterally Back. No spinal deformity Cardiovascular. Heart regular rate and rhythm without murmurs, gallops, or rubs Abdomen. Soft; non tender; non distended; normoactive bowel sounds; no masses or organomegaly Lymph Nodes. No significant cervical, supraclavicular, axillary or inguinal lymphadenopathy noted Musculoskeletal. No deformities; no active joint inflammation Extremities. Normal, no edema Pulses. Dorsalis pedis palpable and symmetric bilaterally  Neurologic. Alert and oriented; speech intact; face symmetrical; moves all extremities well  Goals     . Maintain health/healthy lifestyle        Appointment on 09/07/2023  Component Date Value Ref Range Status  . WBC (White Blood Cell Count) 09/07/2023 10.5 (H)  4.1 - 10.2 10^3/uL Final  . RBC (Red Blood Cell Count) 09/07/2023 4.11 (L)  4.69 - 6.13 10^6/uL Final  . Hemoglobin 09/07/2023 13.4 (L)  14.1 - 18.1 gm/dL Final  . Hematocrit 92/85/7974 39.0 (L)  40.0 - 52.0 % Final  . MCV (Mean Corpuscular Volume) 09/07/2023 94.9  80.0 - 100.0 fl Final  . MCH (Mean Corpuscular Hemoglobin) 09/07/2023 32.6 (H)  27.0 - 31.2 pg Final  . MCHC (Mean Corpuscular Hemoglobin * 09/07/2023 34.4  32.0 - 36.0 gm/dL Final  . Platelet Count 09/07/2023 198  150 - 450 10^3/uL Final  . RDW-CV (Red Cell Distribution Widt* 09/07/2023 12.4  11.6 - 14.8 % Final  . MPV (Mean Platelet Volume) 09/07/2023 10.0  9.4 - 12.4 fl Final  . Neutrophils 09/07/2023 7.27  1.50 - 7.80 10^3/uL Final  . Lymphocytes 09/07/2023 2.40  1.00 - 3.60 10^3/uL Final  . Monocytes 09/07/2023 0.52  0.00 - 1.50 10^3/uL Final  . Eosinophils 09/07/2023 0.22  0.00 - 0.55 10^3/uL Final  . Basophils 09/07/2023 0.07  0.00 - 0.09 10^3/uL Final  .  Neutrophil % 09/07/2023 69.1  32.0 - 70.0 % Final  . Lymphocyte % 09/07/2023 22.8  10.0 - 50.0 % Final  . Monocyte % 09/07/2023 4.9  4.0 - 13.0 % Final  . Eosinophil % 09/07/2023 2.1  1.0 - 5.0 % Final  . Basophil% 09/07/2023 0.7  0.0 - 2.0 % Final  . Immature Granulocyte % 09/07/2023 0.4  <=0.7 % Final  . Immature Granulocyte Count 09/07/2023 0.04  <=0.06 10^3/L Final  . Glucose 09/07/2023 139 (H)  70 - 110 mg/dL Final  . Sodium 92/85/7974 140  136 - 145 mmol/L Final  . Potassium 09/07/2023 4.8  3.6 - 5.1 mmol/L Final  . Chloride 09/07/2023 107  97 - 109 mmol/L Final  . Carbon Dioxide (CO2) 09/07/2023 26.2  22.0 - 32.0 mmol/L Final  . Urea Nitrogen (BUN) 09/07/2023 27 (H)  7 - 25 mg/dL Final  . Creatinine 92/85/7974 1.6 (H)  0.7 - 1.3 mg/dL Final  . Glomerular Filtration Rate (eGFR) 09/07/2023 43 (L)  >60 mL/min/1.73sq m Final  . Calcium 09/07/2023 9.5  8.7 - 10.3 mg/dL Final  . AST  92/85/7974 18  8 - 39 U/L Final  . ALT  09/07/2023 8  6 - 57 U/L Final  . Alk Phos (alkaline Phosphatase) 09/07/2023 85  34 - 104 U/L Final  . Albumin 09/07/2023 4.1  3.5 - 4.8 g/dL Final  . Bilirubin, Total 09/07/2023 0.5  0.3 - 1.2 mg/dL Final  . Protein, Total 09/07/2023 6.3  6.1 - 7.9 g/dL Final  . A/G Ratio 92/85/7974 1.9  1.0 - 5.0 gm/dL Final  . Cholesterol, Total 09/07/2023 106  100 - 200 mg/dL Final  . Triglyceride 92/85/7974 149  35 - 199 mg/dL Final  . HDL (High Density Lipoprotein) Cho* 09/07/2023 35.1  29.0 - 71.0 mg/dL Final  . LDL Calculated 09/07/2023 41  0 - 130 mg/dL Final  . VLDL Cholesterol 09/07/2023 30  mg/dL Final  . Cholesterol/HDL Ratio 09/07/2023 3.0   Final  . PSA (Prostate Specific Antigen), T* 09/07/2023 3.66  0.10 - 4.00 ng/mL Final  . Thyroid  Stimulating Hormone (TSH)  09/07/2023 6.613 (H)  0.450-5.330 uIU/ml uIU/mL Final  . Color 09/07/2023 Light Yellow  Colorless, Straw, Light Yellow, Yellow, Dark Yellow Final  . Clarity 09/07/2023 Clear  Clear Final  . Specific Gravity  09/07/2023 1.018  1.005 - 1.030 Final  . pH, Urine 09/07/2023 5.5  5.0 - 8.0 Final  . Protein, Urinalysis 09/07/2023 1+ (!)  Negative mg/dL Final  . Glucose, Urinalysis 09/07/2023 Trace (!)  Negative mg/dL Final  . Ketones, Urinalysis 09/07/2023 Negative  Negative mg/dL Final  . Blood, Urinalysis 09/07/2023 Negative  Negative Final  . Nitrite, Urinalysis 09/07/2023 Negative  Negative Final  . Leukocyte Esterase, Urinalysis 09/07/2023 Negative  Negative Final  . Bilirubin, Urinalysis 09/07/2023 Negative  Negative Final  . Urobilinogen, Urinalysis 09/07/2023 0.2  0.2 - 1.0 mg/dL Final  . WBC, UA 92/85/7974 <1  <=5 /hpf Final  . Red Blood Cells, Urinalysis 09/07/2023 1  <=3 /hpf Final  . Bacteria, Urinalysis 09/07/2023 0-5  0 - 5 /hpf Final  . Squamous Epithelial Cells, Urinaly* 09/07/2023 0  /hpf Final  . Hemoglobin A1C 09/07/2023 7.3 (H)  4.2 - 5.6 % Final  . Average Blood Glucose (Calc) 09/07/2023 163  mg/dL Final  Ancillary Orders on 03/09/2023  Component Date Value Ref Range Status  . WBC (White Blood Cell Count) 03/09/2023 12.9 (H)  4.1 - 10.2 10^3/uL Final  . RBC (Red Blood Cell Count) 03/09/2023 4.43 (L)  4.69 - 6.13 10^6/uL Final  . Hemoglobin 03/09/2023 14.7  14.1 - 18.1 gm/dL Final  . Hematocrit 98/86/7974 42.6  40.0 - 52.0 % Final  . MCV (Mean Corpuscular Volume) 03/09/2023 96.2  80.0 - 100.0 fl Final  . MCH (Mean Corpuscular Hemoglobin) 03/09/2023 33.2 (H)  27.0 - 31.2 pg Final  . MCHC (Mean Corpuscular Hemoglobin * 03/09/2023 34.5  32.0 - 36.0 gm/dL Final  . Platelet Count 03/09/2023 200  150 - 450 10^3/uL Final  . RDW-CV (Red Cell Distribution Widt* 03/09/2023 12.5  11.6 - 14.8 % Final  . MPV (Mean Platelet Volume) 03/09/2023 10.2  9.4 - 12.4 fl Final  . Neutrophils 03/09/2023 9.05 (H)  1.50 - 7.80 10^3/uL Final  . Lymphocytes 03/09/2023 2.79  1.00 - 3.60 10^3/uL Final  . Monocytes 03/09/2023 0.65  0.00 - 1.50 10^3/uL Final  . Eosinophils 03/09/2023 0.27  0.00 - 0.55  10^3/uL Final  . Basophils 03/09/2023 0.05  0.00 - 0.09 10^3/uL Final  . Neutrophil % 03/09/2023 70.2 (H)  32.0 - 70.0 % Final  . Lymphocyte % 03/09/2023 21.7  10.0 - 50.0 % Final  . Monocyte % 03/09/2023 5.1  4.0 - 13.0 % Final  . Eosinophil % 03/09/2023 2.1  1.0 - 5.0 % Final  . Basophil% 03/09/2023 0.4  0.0 - 2.0 % Final  . Immature Granulocyte % 03/09/2023 0.5  <=0.7 % Final  . Immature Granulocyte Count 03/09/2023 0.06  <=0.06 10^3/L Final  . Glucose 03/09/2023 172 (H)  70 - 110 mg/dL Final  . Sodium 98/86/7974 138  136 - 145 mmol/L Final  . Potassium 03/09/2023 5.2 (H)  3.6 - 5.1 mmol/L Final  . Chloride 03/09/2023 106  97 - 109 mmol/L Final  . Carbon Dioxide (CO2) 03/09/2023 26.3  22.0 - 32.0 mmol/L Final  . Urea Nitrogen (BUN) 03/09/2023 28 (H)  7 - 25 mg/dL Final  . Creatinine 98/86/7974 1.6 (H)  0.7 - 1.3 mg/dL Final  . Glomerular Filtration Rate (eGFR) 03/09/2023 43 (L)  >60 mL/min/1.73sq m Final  . Calcium 03/09/2023 9.3  8.7 -  10.3 mg/dL Final  . AST  98/86/7974 16  8 - 39 U/L Final  . ALT  03/09/2023 8  6 - 57 U/L Final  . Alk Phos (alkaline Phosphatase) 03/09/2023 95  34 - 104 U/L Final  . Albumin 03/09/2023 4.4  3.5 - 4.8 g/dL Final  . Bilirubin, Total 03/09/2023 0.5  0.3 - 1.2 mg/dL Final  . Protein, Total 03/09/2023 6.8  6.1 - 7.9 g/dL Final  . A/G Ratio 98/86/7974 1.8  1.0 - 5.0 gm/dL Final  . Color 98/86/7974 Light Yellow  Colorless, Straw, Light Yellow, Yellow, Dark Yellow Final  . Clarity 03/09/2023 Clear  Clear Final  . Specific Gravity 03/09/2023 1.017  1.005 - 1.030 Final  . pH, Urine 03/09/2023 5.5  5.0 - 8.0 Final  . Protein, Urinalysis 03/09/2023 1+ (!)  Negative mg/dL Final  . Glucose, Urinalysis 03/09/2023 Trace (!)  Negative mg/dL Final  . Ketones, Urinalysis 03/09/2023 Negative  Negative mg/dL Final  . Blood, Urinalysis 03/09/2023 Trace (!)  Negative Final  . Nitrite, Urinalysis 03/09/2023 Negative  Negative Final  . Leukocyte Esterase, Urinalysis  03/09/2023 Negative  Negative Final  . Bilirubin, Urinalysis 03/09/2023 Negative  Negative Final  . Urobilinogen, Urinalysis 03/09/2023 0.2  0.2 - 1.0 mg/dL Final  . WBC, UA 98/86/7974 <1  <=5 /hpf Final  . Red Blood Cells, Urinalysis 03/09/2023 1  <=3 /hpf Final  . Bacteria, Urinalysis 03/09/2023 0-5  0 - 5 /hpf Final  . Squamous Epithelial Cells, Urinaly* 03/09/2023 0  /hpf Final  . Hemoglobin A1C 03/09/2023 7.0 (H)  4.2 - 5.6 % Final  . Average Blood Glucose (Calc) 03/09/2023 154  mg/dL Final  Appointment on 90/83/7975  Component Date Value Ref Range Status  . WBC (White Blood Cell Count) 11/10/2022 9.9  4.1 - 10.2 10^3/uL Final  . RBC (Red Blood Cell Count) 11/10/2022 4.14 (L)  4.69 - 6.13 10^6/uL Final  . Hemoglobin 11/10/2022 13.6 (L)  14.1 - 18.1 gm/dL Final  . Hematocrit 90/83/7975 40.2  40.0 - 52.0 % Final  . MCV (Mean Corpuscular Volume) 11/10/2022 97.1  80.0 - 100.0 fl Final  . MCH (Mean Corpuscular Hemoglobin) 11/10/2022 32.9 (H)  27.0 - 31.2 pg Final  . MCHC (Mean Corpuscular Hemoglobin * 11/10/2022 33.8  32.0 - 36.0 gm/dL Final  . Platelet Count 11/10/2022 192  150 - 450 10^3/uL Final  . RDW-CV (Red Cell Distribution Widt* 11/10/2022 12.4  11.6 - 14.8 % Final  . MPV (Mean Platelet Volume) 11/10/2022 10.2  9.4 - 12.4 fl Final  . Neutrophils 11/10/2022 6.88  1.50 - 7.80 10^3/uL Final  . Lymphocytes 11/10/2022 2.17  1.00 - 3.60 10^3/uL Final  . Monocytes 11/10/2022 0.52  0.00 - 1.50 10^3/uL Final  . Eosinophils 11/10/2022 0.27  0.00 - 0.55 10^3/uL Final  . Basophils 11/10/2022 0.06  0.00 - 0.09 10^3/uL Final  . Neutrophil % 11/10/2022 69.3  32.0 - 70.0 % Final  . Lymphocyte % 11/10/2022 21.8  10.0 - 50.0 % Final  . Monocyte % 11/10/2022 5.2  4.0 - 13.0 % Final  . Eosinophil % 11/10/2022 2.7  1.0 - 5.0 % Final  . Basophil% 11/10/2022 0.6  0.0 - 2.0 % Final  . Immature Granulocyte % 11/10/2022 0.4  <=0.7 % Final  . Immature Granulocyte Count 11/10/2022 0.04  <=0.06 10^3/L  Final  . Glucose 11/10/2022 187 (H)  70 - 110 mg/dL Final  . Sodium 90/83/7975 140  136 - 145 mmol/L Final  . Potassium 11/10/2022 5.2 (  H)  3.6 - 5.1 mmol/L Final  . Chloride 11/10/2022 109  97 - 109 mmol/L Final  . Carbon Dioxide (CO2) 11/10/2022 21.6 (L)  22.0 - 32.0 mmol/L Final  . Urea Nitrogen (BUN) 11/10/2022 36 (H)  7 - 25 mg/dL Final  . Creatinine 90/83/7975 1.7 (H)  0.7 - 1.3 mg/dL Final  . Glomerular Filtration Rate (eGFR) 11/10/2022 40 (L)  >60 mL/min/1.73sq m Final  . Calcium 11/10/2022 9.2  8.7 - 10.3 mg/dL Final  . AST  90/83/7975 18  8 - 39 U/L Final  . ALT  11/10/2022 9  6 - 57 U/L Final  . Alk Phos (alkaline Phosphatase) 11/10/2022 82  34 - 104 U/L Final  . Albumin 11/10/2022 4.1  3.5 - 4.8 g/dL Final  . Bilirubin, Total 11/10/2022 0.5  0.3 - 1.2 mg/dL Final  . Protein, Total 11/10/2022 6.4  6.1 - 7.9 g/dL Final  . A/G Ratio 90/83/7975 1.8  1.0 - 5.0 gm/dL Final  . Cholesterol, Total 11/10/2022 109  100 - 200 mg/dL Final  . Triglyceride 90/83/7975 130  35 - 199 mg/dL Final  . HDL (High Density Lipoprotein) Cho* 11/10/2022 36.0  29.0 - 71.0 mg/dL Final  . LDL Calculated 11/10/2022 47  0 - 130 mg/dL Final  . VLDL Cholesterol 11/10/2022 26  mg/dL Final  . Cholesterol/HDL Ratio 11/10/2022 3.0   Final  . Thyroid  Stimulating Hormone (TSH) 11/10/2022 5.139  0.450-5.330 uIU/ml uIU/mL Final  . Color 11/10/2022 Light Yellow  Colorless, Straw, Light Yellow, Yellow, Dark Yellow Final  . Clarity 11/10/2022 Clear  Clear Final  . Specific Gravity 11/10/2022 1.022  1.005 - 1.030 Final  . pH, Urine 11/10/2022 5.5  5.0 - 8.0 Final  . Protein, Urinalysis 11/10/2022 1+ (!)  Negative mg/dL Final  . Glucose, Urinalysis 11/10/2022 Trace (!)  Negative mg/dL Final  . Ketones, Urinalysis 11/10/2022 Negative  Negative mg/dL Final  . Blood, Urinalysis 11/10/2022 Trace (!)  Negative Final  . Nitrite, Urinalysis 11/10/2022 Negative  Negative Final  . Leukocyte Esterase, Urinalysis 11/10/2022  Negative  Negative Final  . Bilirubin, Urinalysis 11/10/2022 Negative  Negative Final  . Urobilinogen, Urinalysis 11/10/2022 0.2  0.2 - 1.0 mg/dL Final  . WBC, UA 90/83/7975 <1  <=5 /hpf Final  . Red Blood Cells, Urinalysis 11/10/2022 <1  <=3 /hpf Final  . Bacteria, Urinalysis 11/10/2022 0-5  0 - 5 /hpf Final  . Squamous Epithelial Cells, Urinaly* 11/10/2022 0  /hpf Final  Office Visit on 10/16/2022  Component Date Value Ref Range Status  . Hemoglobin A1C 10/16/2022 7.7 (H)  4.2 - 5.6 % Final  . Average Blood Glucose (Calc) 10/16/2022 174  mg/dL Final   DIAGNOSIS: Annual physical exam  (primary encounter diagnosis)  Depression screening (Z13.31) Plan: Depression Screen -(PHQ- 2/9, BDI)  Primary hypertension  Coronary arteriosclerosis  OSA (obstructive sleep apnea)  Type 2 diabetes mellitus with stage 3 chronic kidney disease, with long-term current use of insulin, unspecified whether stage 3a or 3b CKD (CMS/HHS-HCC)  Mixed hyperlipidemia  Class 2 severe obesity due to excess calories with serious comorbidity in adult, unspecified BMI (CMS/HHS-HCC)  Anxiety and depression  BPH with obstruction/lower urinary tract symptoms   PLAN: CXR today due to cough. F/u with Urology and Endocrinology. Same meds for now. Labs 3 mo. RTC 6 mo, sooner if needed     Attestation Statement:   I personally performed the service. (TP)  Reyes JONETTA Costa, MD, MD

## 2023-09-15 ENCOUNTER — Ambulatory Visit: Admitting: Urology

## 2023-09-15 ENCOUNTER — Encounter: Payer: Self-pay | Admitting: Urology

## 2023-09-15 VITALS — BP 141/71 | HR 71 | Ht 66.0 in | Wt 222.0 lb

## 2023-09-15 DIAGNOSIS — C61 Malignant neoplasm of prostate: Secondary | ICD-10-CM

## 2023-09-15 DIAGNOSIS — N401 Enlarged prostate with lower urinary tract symptoms: Secondary | ICD-10-CM

## 2023-09-15 DIAGNOSIS — R351 Nocturia: Secondary | ICD-10-CM

## 2023-09-15 LAB — BLADDER SCAN AMB NON-IMAGING

## 2023-09-15 NOTE — Patient Instructions (Signed)

## 2023-09-15 NOTE — Progress Notes (Signed)
 09/15/2023 9:37 PM   [place] LELON Lot 02/27/1940 [id number]  Referring provider: Auston Reyes BIRCH, MD [address] [facility] [place],  [place] [address]  Urological history: 1.  Low risk prostate cancer - PSA (08/2023) 3.66 - bx (2021) - Gleason 3 + 3, iPSA 9.31  2. BPH with LU TS - finasteride  5 mg daily and alfuzosin 10 gm daily   No chief complaint on file.  HPI: Mario Robinson is a 83 y.o. man who presents today for BPH.   Previous records reviewed.   He is taking the finasteride  and alfuzosin daily.   For the last 2 years, he has been having issues with urgency, urge incontinence and nocuturia x 2-3.  He drinks about 4 bottles of water daily.   He will also drink Coke Zero.  He was prescribed oxybutynin by Dr. Auston, but he discontinued the medication as he didn't find it made a difference.  Patient denies any modifying or aggravating factors.  Patient denies any recent UTI's, gross hematuria, dysuria or suprapubic/flank pain.  Patient denies any fevers, chills, nausea or vomiting.    Serum creatinine (08/2023) 1.6, eGFR 43  PSA (08/2023) 3.66 (7.32)  UA (08/2023) unremarkable  Hemoglobin A1c (08/2023) 7.3  Former smoker.   Occasionally has a mixed drink.    PVR 80 mL    PMH: Past Medical History:  Diagnosis Date   Arthritis    Diabetes mellitus    GERD (gastroesophageal reflux disease)    HLD (hyperlipidemia)    HTN (hypertension)    Obesity    Prostate disorder    Sleep apnea     Surgical History: Past Surgical History:  Procedure Laterality Date   ANKLE SURGERY     APPENDECTOMY     CARDIAC CATHETERIZATION     CATARACT EXTRACTION W/PHACO Left 05/01/2016   Procedure: CATARACT EXTRACTION PHACO AND INTRAOCULAR LENS PLACEMENT (IOC);  Surgeon: Adine Oneil Novak, MD;  Location: [facility] ORS;  Service: Ophthalmology;  Laterality: Left;  Lot # [id number] H US : 01:35.1 AP%: 11.2 CDE: 11.11   CATARACT EXTRACTION W/PHACO Right 05/15/2016    Procedure: CATARACT EXTRACTION PHACO AND INTRAOCULAR LENS PLACEMENT (IOC);  Surgeon: Adine Oneil Novak, MD;  Location: [facility] ORS;  Service: Ophthalmology;  Laterality: Right;  US  1:06.8 AP% 11.0 CDE 7.56 Fluid pack lot # [id number]   COLONOSCOPY     FRACTURE SURGERY     PROSTATE BIOPSY     TONSILLECTOMY      Home Medications:  Allergies as of 09/15/2023   No Known Allergies      Medication List        Accurate as of September 15, 2023  9:37 PM. If you have any questions, ask your nurse or doctor.          alfuzosin 10 MG 24 hr tablet Commonly known as: UROXATRAL   amLODipine 5 MG tablet Commonly known as: NORVASC   aspirin 81 MG tablet Take 81 mg by mouth daily. Reported on 06/15/2015   famotidine 20 MG tablet Commonly known as: PEPCID Take by mouth.   finasteride  5 MG tablet Commonly known as: PROSCAR  TAKE 1 TABLET BY MOUTH DAILY.   glipiZIDE 10 MG 24 hr tablet Commonly known as: GLUCOTROL XL Take 10 mg by mouth daily.   Lantus SoloStar 100 UNIT/ML Solostar Pen Generic drug: insulin glargine   lisinopril 10 MG tablet Commonly known as: ZESTRIL Take 1 tablet by mouth daily.   metoprolol tartrate 50 MG tablet Commonly  known as: LOPRESSOR Take 50 mg by mouth 2 (two) times daily.   NovoLIN 70/30 ReliOn (70-30) 100 UNIT/ML injection Generic drug: insulin NPH-regular Human Inject into the skin 2 (two) times daily with a meal. 15 UNITS   PARoxetine 20 MG tablet Commonly known as: PAXIL   rosuvastatin 10 MG tablet Commonly known as: CRESTOR TAKE 1 TABLET ONE TIME DAILY        Allergies: No Known Allergies  Family History: Family History  Problem Relation Age of Onset   Heart attack Unknown    Colon cancer Mother    Prostate cancer Brother    Hypertension Unknown    Arthritis Unknown    Bladder Cancer Neg Hx    Kidney cancer Neg Hx     Social History: See HPI for pertinent social history  ROS: Pertinent ROS in HPI  Physical Exam: BP (!)  141/71   Pulse 71   Ht 5' 6 (1.676 m)   Wt 222 lb (100.7 kg)   BMI 35.83 kg/m   Constitutional:  Well nourished. Alert and oriented, No acute distress. HEENT: [place] AT, moist mucus membranes.  Trachea midline Cardiovascular: No clubbing, cyanosis, or edema. Respiratory: Normal respiratory effort, no increased work of breathing. Neurologic: Grossly intact, no focal deficits, moving all 4 extremities. Psychiatric: Normal mood and affect.  Laboratory Data: See EPIC and HPI  I have reviewed the labs.   Pertinent Imaging:  09/15/23 15:58  Scan Result 80ml   Assessment & Plan:    1. Prostate cancer - PSA corrected value 7.32 - ideally, PSA should not increase while on finasteride , but he is not interested in any aggressive approach citing his age and being a widower, he is also concerned about the lung nodule and would like that work up completed before looking into other things - continue to monitor   2. BPH with LU TS - more bothered by symptoms  - no signs of retention or infection - UA benign  - PVR < 300 cc  - most bothersome symptoms are urgency and urge incontinence - encouraged avoiding bladder irritants, fluid restriction before bedtime and timed voiding's - Continue tamsulosin 0.4 mg daily and finasteride  5 mg daily - explained that he is on maximum medical therapy for BPH and recommended further work up for BOO with cysto and TRUS, he defers at this time as he is having a work up for a nodule in his lung on a recent chest Xray - educated on red flag symptoms: acute retention, gross hematuria, fever, severe pain - advised to call clinic or go to the ED if these occur - would like information on cystoscopy, so he can discuss with his daughter whether or not he would want to consider it in the future   Return if symptoms worsen or fail to improve.  These notes generated with voice recognition software. I apologize for typographical errors.  CLOTILDA HELON RIGGERS  [facility]  [facility] [address]  [address] [place], [place] [address] [phone]

## 2023-09-16 ENCOUNTER — Other Ambulatory Visit: Payer: Self-pay | Admitting: Internal Medicine

## 2023-09-16 DIAGNOSIS — R911 Solitary pulmonary nodule: Secondary | ICD-10-CM

## 2023-09-22 ENCOUNTER — Ambulatory Visit
Admission: RE | Admit: 2023-09-22 | Discharge: 2023-09-22 | Disposition: A | Discharge status: Home / Self Care | Source: Ambulatory Visit | Attending: Internal Medicine | Admitting: Internal Medicine

## 2023-09-22 DIAGNOSIS — J479 Bronchiectasis, uncomplicated: Secondary | ICD-10-CM | POA: Diagnosis not present

## 2023-09-22 DIAGNOSIS — R911 Solitary pulmonary nodule: Secondary | ICD-10-CM | POA: Diagnosis not present

## 2023-09-22 DIAGNOSIS — R918 Other nonspecific abnormal finding of lung field: Secondary | ICD-10-CM | POA: Diagnosis not present

## 2023-12-15 DIAGNOSIS — E782 Mixed hyperlipidemia: Secondary | ICD-10-CM | POA: Diagnosis not present

## 2023-12-15 DIAGNOSIS — E1122 Type 2 diabetes mellitus with diabetic chronic kidney disease: Secondary | ICD-10-CM | POA: Diagnosis not present

## 2023-12-15 DIAGNOSIS — Z79899 Other long term (current) drug therapy: Secondary | ICD-10-CM | POA: Diagnosis not present

## 2023-12-15 DIAGNOSIS — Z794 Long term (current) use of insulin: Secondary | ICD-10-CM | POA: Diagnosis not present

## 2023-12-15 DIAGNOSIS — N183 Chronic kidney disease, stage 3 unspecified: Secondary | ICD-10-CM | POA: Diagnosis not present

## 2024-03-18 ENCOUNTER — Other Ambulatory Visit: Payer: Self-pay | Admitting: Internal Medicine

## 2024-03-18 DIAGNOSIS — R911 Solitary pulmonary nodule: Secondary | ICD-10-CM
